# Patient Record
Sex: Female | Born: 1965
Health system: Southern US, Community
[De-identification: ages and names within clinical notes are randomized; demographics above are authoritative.]

## PROBLEM LIST (undated history)

## (undated) DIAGNOSIS — F419 Anxiety disorder, unspecified: Secondary | ICD-10-CM

## (undated) DIAGNOSIS — I10 Essential (primary) hypertension: Secondary | ICD-10-CM

## (undated) DIAGNOSIS — J302 Other seasonal allergic rhinitis: Secondary | ICD-10-CM

## (undated) DIAGNOSIS — F4310 Post-traumatic stress disorder, unspecified: Secondary | ICD-10-CM

## (undated) DIAGNOSIS — F329 Major depressive disorder, single episode, unspecified: Secondary | ICD-10-CM

## (undated) DIAGNOSIS — E119 Type 2 diabetes mellitus without complications: Secondary | ICD-10-CM

## (undated) DIAGNOSIS — E785 Hyperlipidemia, unspecified: Secondary | ICD-10-CM

## (undated) DIAGNOSIS — F32A Depression, unspecified: Secondary | ICD-10-CM

## (undated) DIAGNOSIS — M1990 Unspecified osteoarthritis, unspecified site: Secondary | ICD-10-CM

## (undated) HISTORY — DX: Unspecified osteoarthritis, unspecified site: M19.90

## (undated) HISTORY — DX: Type 2 diabetes mellitus without complications: E11.9

## (undated) HISTORY — DX: Post-traumatic stress disorder, unspecified: F43.10

## (undated) HISTORY — DX: Essential (primary) hypertension: I10

## (undated) HISTORY — DX: Hyperlipidemia, unspecified: E78.5

---

## 2004-12-15 ENCOUNTER — Encounter: Admission: RE | Admit: 2004-12-15 | Discharge: 2004-12-15 | Payer: Self-pay | Admitting: Internal Medicine

## 2005-07-31 ENCOUNTER — Emergency Department (HOSPITAL_COMMUNITY): Admission: EM | Admit: 2005-07-31 | Discharge: 2005-07-31 | Payer: Self-pay | Admitting: *Deleted

## 2005-12-29 ENCOUNTER — Encounter: Admission: RE | Admit: 2005-12-29 | Discharge: 2005-12-29 | Payer: Self-pay | Admitting: Internal Medicine

## 2006-01-06 ENCOUNTER — Encounter: Admission: RE | Admit: 2006-01-06 | Discharge: 2006-01-06 | Payer: Self-pay | Admitting: Internal Medicine

## 2010-06-02 ENCOUNTER — Other Ambulatory Visit: Payer: Self-pay | Admitting: *Deleted

## 2010-06-02 DIAGNOSIS — N6320 Unspecified lump in the left breast, unspecified quadrant: Secondary | ICD-10-CM

## 2010-06-06 ENCOUNTER — Ambulatory Visit
Admission: RE | Admit: 2010-06-06 | Discharge: 2010-06-06 | Disposition: A | Discharge status: Home / Self Care | Payer: BC Managed Care – PPO | Source: Ambulatory Visit | Attending: *Deleted | Admitting: *Deleted

## 2010-06-06 ENCOUNTER — Other Ambulatory Visit: Payer: Self-pay | Admitting: *Deleted

## 2010-06-06 DIAGNOSIS — N6320 Unspecified lump in the left breast, unspecified quadrant: Secondary | ICD-10-CM

## 2012-01-26 ENCOUNTER — Other Ambulatory Visit: Payer: Self-pay | Admitting: Family Medicine

## 2012-01-26 DIAGNOSIS — Z1231 Encounter for screening mammogram for malignant neoplasm of breast: Secondary | ICD-10-CM

## 2012-03-08 ENCOUNTER — Ambulatory Visit: Payer: BC Managed Care – PPO

## 2012-03-14 ENCOUNTER — Ambulatory Visit
Admission: RE | Admit: 2012-03-14 | Discharge: 2012-03-14 | Disposition: A | Discharge status: Home / Self Care | Payer: Private Health Insurance - Indemnity | Source: Ambulatory Visit | Attending: Family Medicine | Admitting: Family Medicine

## 2012-03-14 DIAGNOSIS — Z1231 Encounter for screening mammogram for malignant neoplasm of breast: Secondary | ICD-10-CM

## 2013-06-28 ENCOUNTER — Other Ambulatory Visit: Payer: Self-pay

## 2013-06-28 ENCOUNTER — Other Ambulatory Visit (HOSPITAL_COMMUNITY)
Admission: RE | Admit: 2013-06-28 | Discharge: 2013-06-28 | Disposition: A | Discharge status: Home / Self Care | Payer: BC Managed Care – PPO | Source: Ambulatory Visit | Attending: Family Medicine | Admitting: Family Medicine

## 2013-06-28 ENCOUNTER — Other Ambulatory Visit: Payer: Self-pay | Admitting: Family Medicine

## 2013-06-28 DIAGNOSIS — Z1231 Encounter for screening mammogram for malignant neoplasm of breast: Secondary | ICD-10-CM

## 2013-06-28 DIAGNOSIS — Z124 Encounter for screening for malignant neoplasm of cervix: Secondary | ICD-10-CM | POA: Insufficient documentation

## 2013-07-06 ENCOUNTER — Inpatient Hospital Stay: Admission: RE | Admit: 2013-07-06 | Payer: Private Health Insurance - Indemnity | Source: Ambulatory Visit

## 2013-08-01 ENCOUNTER — Ambulatory Visit: Payer: Private Health Insurance - Indemnity

## 2013-08-02 ENCOUNTER — Ambulatory Visit
Admission: RE | Admit: 2013-08-02 | Discharge: 2013-08-02 | Disposition: A | Discharge status: Home / Self Care | Payer: BC Managed Care – PPO | Source: Ambulatory Visit

## 2013-08-02 DIAGNOSIS — Z1231 Encounter for screening mammogram for malignant neoplasm of breast: Secondary | ICD-10-CM

## 2015-02-25 ENCOUNTER — Other Ambulatory Visit: Payer: Self-pay

## 2015-02-25 DIAGNOSIS — Z1231 Encounter for screening mammogram for malignant neoplasm of breast: Secondary | ICD-10-CM

## 2016-08-12 ENCOUNTER — Encounter (HOSPITAL_COMMUNITY): Payer: Self-pay | Admitting: Emergency Medicine

## 2016-08-12 ENCOUNTER — Emergency Department (HOSPITAL_COMMUNITY): Payer: 59

## 2016-08-12 ENCOUNTER — Emergency Department (HOSPITAL_COMMUNITY)
Admission: EM | Admit: 2016-08-12 | Discharge: 2016-08-12 | Disposition: A | Discharge status: Home / Self Care | Payer: 59 | Attending: Emergency Medicine | Admitting: Emergency Medicine

## 2016-08-12 DIAGNOSIS — Z79899 Other long term (current) drug therapy: Secondary | ICD-10-CM | POA: Insufficient documentation

## 2016-08-12 DIAGNOSIS — R0609 Other forms of dyspnea: Secondary | ICD-10-CM | POA: Insufficient documentation

## 2016-08-12 DIAGNOSIS — E119 Type 2 diabetes mellitus without complications: Secondary | ICD-10-CM | POA: Insufficient documentation

## 2016-08-12 DIAGNOSIS — Z794 Long term (current) use of insulin: Secondary | ICD-10-CM | POA: Diagnosis not present

## 2016-08-12 DIAGNOSIS — R0602 Shortness of breath: Secondary | ICD-10-CM

## 2016-08-12 DIAGNOSIS — R531 Weakness: Secondary | ICD-10-CM | POA: Diagnosis not present

## 2016-08-12 DIAGNOSIS — Z7982 Long term (current) use of aspirin: Secondary | ICD-10-CM | POA: Diagnosis not present

## 2016-08-12 HISTORY — DX: Depression, unspecified: F32.A

## 2016-08-12 HISTORY — DX: Major depressive disorder, single episode, unspecified: F32.9

## 2016-08-12 HISTORY — DX: Anxiety disorder, unspecified: F41.9

## 2016-08-12 HISTORY — DX: Type 2 diabetes mellitus without complications: E11.9

## 2016-08-12 HISTORY — DX: Other seasonal allergic rhinitis: J30.2

## 2016-08-12 LAB — CBC WITH DIFFERENTIAL/PLATELET
Basophils Absolute: 0 10*3/uL (ref 0.0–0.1)
Basophils Relative: 0 %
Eosinophils Absolute: 0.1 10*3/uL (ref 0.0–0.7)
Eosinophils Relative: 1 %
HEMATOCRIT: 37.9 % (ref 36.0–46.0)
HEMOGLOBIN: 13.2 g/dL (ref 12.0–15.0)
LYMPHS ABS: 2.6 10*3/uL (ref 0.7–4.0)
LYMPHS PCT: 36 %
MCH: 30.3 pg (ref 26.0–34.0)
MCHC: 34.8 g/dL (ref 30.0–36.0)
MCV: 86.9 fL (ref 78.0–100.0)
MONOS PCT: 4 %
Monocytes Absolute: 0.3 10*3/uL (ref 0.1–1.0)
NEUTROS PCT: 59 %
Neutro Abs: 4.4 10*3/uL (ref 1.7–7.7)
Platelets: 261 10*3/uL (ref 150–400)
RBC: 4.36 MIL/uL (ref 3.87–5.11)
RDW: 12.6 % (ref 11.5–15.5)
WBC: 7.4 10*3/uL (ref 4.0–10.5)

## 2016-08-12 LAB — COMPREHENSIVE METABOLIC PANEL
ALT: 26 U/L (ref 14–54)
AST: 32 U/L (ref 15–41)
Albumin: 3.7 g/dL (ref 3.5–5.0)
Alkaline Phosphatase: 45 U/L (ref 38–126)
Anion gap: 9 (ref 5–15)
BUN: 14 mg/dL (ref 6–20)
CHLORIDE: 105 mmol/L (ref 101–111)
CO2: 23 mmol/L (ref 22–32)
Calcium: 9.2 mg/dL (ref 8.9–10.3)
Creatinine, Ser: 0.75 mg/dL (ref 0.44–1.00)
Glucose, Bld: 215 mg/dL — ABNORMAL HIGH (ref 65–99)
POTASSIUM: 4.4 mmol/L (ref 3.5–5.1)
SODIUM: 137 mmol/L (ref 135–145)
Total Bilirubin: 0.6 mg/dL (ref 0.3–1.2)
Total Protein: 7.4 g/dL (ref 6.5–8.1)

## 2016-08-12 LAB — BRAIN NATRIURETIC PEPTIDE: B Natriuretic Peptide: 15 pg/mL (ref 0.0–100.0)

## 2016-08-12 LAB — I-STAT TROPONIN, ED: Troponin i, poc: 0.02 ng/mL (ref 0.00–0.08)

## 2016-08-12 LAB — TROPONIN I

## 2016-08-12 NOTE — ED Notes (Signed)
EDPA Provider at bedside. 

## 2016-08-12 NOTE — Discharge Instructions (Signed)
Your lab work, EKG, chest x-ray and EKG were reassuring today.   Given the reassuring work up today you are safe to be discharged today with follow up with your primary care provider within 7 days for further discussion of your chest discomfort and shortness of breath.  Your primary care provider may recommend cardiology referral or additional testing.   Continue taking your home medications as prescribed.   Return to the emergency department if your symptoms worsen, if you develop chest pain associated with shortness of breath, nausea, vomiting, light-headedness or your symptoms become concerning in any way

## 2016-08-12 NOTE — ED Provider Notes (Signed)
Patient was handed off to me by previous EDPA Upstill at shift change pending delta troponin and pulse ox with ambulation. Please see previous EDPA note for full HPI, ROS and PE.  Briefly patient is a 51 yo female with h/o T2DM who presents with mild chest discomfort and SOB with exertion.  H/o stress and anxiety. Plan at discharge is to discharge if troponin and pulse ox with ambulation is normal.    I reviewed patient's ED lab work.  VS have been wnl and stable in ED.  CBC, CMP, BNP, troponin x 2, CXR and EKG are reassuring.  Patient ambulated with SpO2 > 95%.  Denied CP or SOB or palpitations.   Patient considered safe for discharge at this time. ED return precautions given. Advised to f/u with PCP this week for further discussion of today's ED visit and possible cardiology referral.    Liberty HandyGibbons, Darcee Dekker J, PA-C 08/12/16 1659    Raeford RazorKohut, Stephen, MD 08/12/16 [phone]

## 2016-08-12 NOTE — ED Triage Notes (Signed)
Pt c/o shortness of breath and tachycardia x 2 days. Patient  States she has much stress and anxiety but yesterday SOB worsened when climbing stairs, pt states she was "really struggling to breathe and felt like chest was pounding". Pt teary in triage and worried it is cardiac related due to multiple family members with cardiac disease. Pt reports she is better at rest.

## 2016-08-12 NOTE — ED Notes (Signed)
PATIENT WALKED ONCE AROUND THE ED FLOOR O2 STATS WERE 95-100 HEART RATE 98 -110

## 2016-08-12 NOTE — ED Provider Notes (Signed)
[facility]-EMERGENCY DEPT Provider Note   CSN: [id number] Arrival date & time: 08/12/16  1143     History   Chief Complaint Chief Complaint  Patient presents with  . Shortness of Breath    HPI Mary Barrera is a 51 y.o. female.  Patient has been having increasing SOB with exertion over the 2-3 days. She report minimal chest discomfort when your symptoms of SOB are heightened. No nausea, vomiting. No cough or fever. She denies LE edema. She feels she has been overexerting herself recently with family events. No history of CHF, heart disease. She currently has no symptoms.    The history is provided by the patient. No language interpreter was used.  Shortness of Breath  Pertinent negatives include no fever, no chest pain, no vomiting, no abdominal pain and no leg swelling.    Past Medical History:  Diagnosis Date  . Anxiety   . Depression   . Diabetes mellitus without complication (HCC)   . Seasonal allergies     There are no active problems to display for this patient.   History reviewed. No pertinent surgical history.  OB History    No data available       Home Medications    Prior to Admission medications   Medication Sig Start Date End Date Taking? Authorizing Provider  aspirin EC 81 MG tablet Take 81 mg by mouth daily.   Yes [provider]  B Complex-C (B-COMPLEX WITH VITAMIN C) tablet Take 1 tablet by mouth daily.   Yes [provider]  celecoxib (CELEBREX) 200 MG capsule Take 200 mg by mouth daily. 07/17/16  Yes [provider]  clonazePAM (KLONOPIN) 1 MG tablet Take 1 mg by mouth 2 (two) times daily. 07/29/16  Yes [provider]  desvenlafaxine (PRISTIQ) 50 MG 24 hr tablet Take 50 mg by mouth daily. 07/29/16  Yes [provider]  fluticasone (FLONASE) 50 MCG/ACT nasal spray Place 2 sprays into both nostrils 2 (two) times daily as needed for allergies. 05/22/16  Yes [provider]  gabapentin (NEURONTIN) 100 MG  capsule Take 100 mg by mouth at bedtime.   Yes [provider]  JUNEL FE 1/20 1-20 MG-MCG tablet Take 1 tablet by mouth daily. 06/08/16  Yes [provider]  LANTUS SOLOSTAR 100 UNIT/ML Solostar Pen Inject 25 Units into the skin 2 (two) times daily. 07/15/16  Yes [provider]  ONGLYZA 5 MG TABS tablet Take 5 mg by mouth daily. 07/15/16  Yes [provider]  prazosin (MINIPRESS) 2 MG capsule Take 2 mg by mouth at bedtime. 07/29/16  Yes [provider]  ramipril (ALTACE) 5 MG capsule Take 5 mg by mouth at bedtime. 07/09/16  Yes [provider]  simvastatin (ZOCOR) 40 MG tablet Take 40 mg by mouth at bedtime. 07/28/16  Yes [provider]  tetrahydrozoline 0.05 % ophthalmic solution Place 1 drop into both eyes every 6 (six) hours as needed (irritated eyes).   Yes [provider]  VITAMIN D, CHOLECALCIFEROL, PO Take 2 tablets by mouth daily.   Yes [provider]    Family History History reviewed. No pertinent family history.  Social History Social History  Substance Use Topics  . Smoking status: Never Smoker  . Smokeless tobacco: Never Used  . Alcohol use No     Allergies   Atorvastatin; Tramadol; and Vicodin [hydrocodone-acetaminophen]   Review of Systems Review of Systems  Constitutional: Positive for fatigue. Negative for chills and fever.  Respiratory: Positive for shortness of breath.   Cardiovascular: Negative.  Negative for chest pain and leg swelling.  Gastrointestinal: Negative.  Negative for abdominal pain, nausea and vomiting.  Musculoskeletal: Negative.   Skin: Negative.   Neurological: Positive for weakness (Generalized).     Physical Exam Updated Vital Signs BP (!) 138/92   Pulse 82   Resp 18   SpO2 98%   Physical Exam  Constitutional: She is oriented to person, place, and time. She appears well-developed and well-nourished.  HENT:  Head: Normocephalic.  Neck: Normal range of  motion. Neck supple.  Cardiovascular: Normal rate and regular rhythm.   Pulmonary/Chest: Effort normal and breath sounds normal. She has no wheezes. She has no rales.  Abdominal: Soft. Bowel sounds are normal. There is no tenderness. There is no rebound and no guarding.  Musculoskeletal: Normal range of motion. She exhibits no edema.  Neurological: She is alert and oriented to person, place, and time.  Skin: Skin is warm and dry. No rash noted.  Psychiatric: She has a normal mood and affect.     ED Treatments / Results  Labs (all labs ordered are listed, but only abnormal results are displayed) Labs Reviewed  COMPREHENSIVE METABOLIC PANEL - Abnormal; Notable for the following:       Result Value   Glucose, Bld 215 (*)    All other components within normal limits  TROPONIN I  CBC WITH DIFFERENTIAL/PLATELET  BRAIN NATRIURETIC PEPTIDE    EKG  EKG Interpretation None       Radiology Dg Chest 2 View  Result Date: 08/12/2016 CLINICAL DATA:  Increasing shortness of breath and tachycardia EXAM: CHEST  2 VIEW COMPARISON:  04/08/2007 FINDINGS: The heart size and mediastinal contours are within normal limits. Both lungs are clear. The visualized skeletal structures are unremarkable. IMPRESSION: No active cardiopulmonary disease. Electronically Signed   By: Alcide CleverMark  Lukens M.D.   On: 08/12/2016 12:27    Procedures Procedures (including critical care time)  Medications Ordered in ED Medications - No data to display   Initial Impression / Assessment and Plan / ED Course  I have reviewed the triage vital signs and the nursing notes.  Pertinent labs & imaging results that were available during my care of the patient were reviewed by me and considered in my medical decision making (see chart for details).     Patient to ED with concern for worsening exertional dyspnea over the last 2-3 days without fever. Mild chest pain. No hsitory of CHF, or MI.   Labs are reassuring. Initial  troponin is <0.03. Delta trop pending. CXR clear.   Doubt dyspnea is cardiac in origin. Anticipate discharge home to follow up with PCP. Patient care signed out to Sharen Hecklaudia Gibbons, PA-C, for final re-evaluation and review of pending labs.     Final Clinical Impressions(s) / ED Diagnoses   Final diagnoses:  None   1. Exertional dyspnea  New Prescriptions New Prescriptions   No medications on file     Danne HarborUpstill, Shaneka Efaw, PA-C 08/15/16 2335    Lorre NickAllen, Anthony, MD 08/18/16 [phone]

## 2016-08-12 NOTE — ED Notes (Signed)
Patient transported to X-ray 

## 2016-08-17 ENCOUNTER — Telehealth: Payer: Self-pay

## 2016-08-17 NOTE — Telephone Encounter (Signed)
SENT NOTES TO SCHEDULING 

## 2016-08-19 ENCOUNTER — Encounter: Payer: Self-pay | Admitting: Interventional Cardiology

## 2016-08-19 DIAGNOSIS — R0602 Shortness of breath: Secondary | ICD-10-CM | POA: Insufficient documentation

## 2016-08-19 DIAGNOSIS — I10 Essential (primary) hypertension: Secondary | ICD-10-CM

## 2016-08-19 HISTORY — DX: Essential (primary) hypertension: I10

## 2016-08-19 NOTE — Progress Notes (Signed)
Cardiology Office Note    Date:  08/20/2016   ID:  Comer LocketKathy L Atiyeh, DOB 05/07/1965, MRN [id number]  PCP:  Mila PalmerWolters, Sharon, MD  Cardiologist: Lesleigh NoeHenry W Esten Dollar III, MD   Chief Complaint  Patient presents with  . Shortness of Breath    Dyspnea on exertion  . Palpitations    History of Present Illness:  Mary ArcherKathy L Barrera is a 51 y.o. female referred by Dr. Raeford RazorStephen Kohut for evaluation of shortness of breath  Since Mother's Day, Mrs. Broadus JohnWarren has been noticing dyspnea on exertion, strong beating of her heart, and fatigue. She has difficulty climbing the flight of stairs at work without dyspnea. There is no associated tightness/burning/discomfort in the chest. She denies orthopnea. He exertional intolerance is new, although she states she is generally very sedentary.  Multiple significant risk factors including diabetes with poor control (A1c greater than 7.5 when last evaluated), essential hypertension, hyperlipidemia, and obesity. Possible sleep apnea.  Past Medical History:  Diagnosis Date  . Anxiety   . Depression   . Diabetes mellitus without complication (HCC)   . Hypertension, essential 08/19/2016  . Seasonal allergies     History reviewed. No pertinent surgical history.  Current Medications: Outpatient Medications Prior to Visit  Medication Sig Dispense Refill  . aspirin EC 81 MG tablet Take 81 mg by mouth daily.    . B Complex-C (B-COMPLEX WITH VITAMIN C) tablet Take 1 tablet by mouth daily.    . celecoxib (CELEBREX) 200 MG capsule Take 200 mg by mouth daily.  5  . clonazePAM (KLONOPIN) 1 MG tablet Take 1 mg by mouth 2 (two) times daily.    Marland Kitchen. desvenlafaxine (PRISTIQ) 50 MG 24 hr tablet Take 50 mg by mouth daily.    . fluticasone (FLONASE) 50 MCG/ACT nasal spray Place 2 sprays into both nostrils 2 (two) times daily as needed for allergies.  3  . gabapentin (NEURONTIN) 100 MG capsule Take 100 mg by mouth at bedtime.    Colleen Can. JUNEL FE 1/20 1-20 MG-MCG tablet Take 1 tablet by mouth  daily.  1  . LANTUS SOLOSTAR 100 UNIT/ML Solostar Pen Inject 25 Units into the skin 2 (two) times daily.  5  . ONGLYZA 5 MG TABS tablet Take 5 mg by mouth daily.  5  . prazosin (MINIPRESS) 2 MG capsule Take 2 mg by mouth at bedtime.    . ramipril (ALTACE) 5 MG capsule Take 5 mg by mouth at bedtime.  4  . simvastatin (ZOCOR) 40 MG tablet Take 40 mg by mouth at bedtime.  1  . tetrahydrozoline 0.05 % ophthalmic solution Place 1 drop into both eyes every 6 (six) hours as needed (irritated eyes).    Marland Kitchen. VITAMIN D, CHOLECALCIFEROL, PO Take 2 tablets by mouth daily.     No facility-administered medications prior to visit.      Allergies:   Atorvastatin; Tramadol; and Vicodin [hydrocodone-acetaminophen]   Social History   Social History  . Marital status: Single    Spouse name: N/A  . Number of children: N/A  . Years of education: N/A   Social History Main Topics  . Smoking status: Never Smoker  . Smokeless tobacco: Never Used  . Alcohol use No  . Drug use: No  . Sexual activity: No   Other Topics Concern  . None   Social History Narrative  . None     Family History:  The patient's family history is not on file.   ROS:   Please see  the history of present illness.    Admits to PTSD and no details about precipitant. History of snoring, wheezing, anxiety, depression, chronic back pain, dizziness, easy bruising.  All other systems reviewed and are negative.   PHYSICAL EXAM:   VS:  BP 112/70   Pulse 83   Ht 5\' 6"  (1.676 m)   Wt 231 lb 12.8 oz (105.1 kg)   BMI 37.41 kg/m    GEN: Well nourished, well developed, in no acute distress . Moderate obesity HEENT: normal  Neck: no JVD, carotid bruits, or masses Cardiac: RRR; no murmurs, rubs, or gallops,no edema  Respiratory:  clear to auscultation bilaterally, normal work of breathing GI: soft, nontender, nondistended, + BS MS: no deformity or atrophy  Skin: warm and dry, no rash Neuro:  Alert and Oriented x 3, Strength and  sensation are intact Psych: euthymic mood, full affect  Wt Readings from Last 3 Encounters:  08/20/16 231 lb 12.8 oz (105.1 kg)      Studies/Labs Reviewed:   EKG:  EKG  Sinus rhythm, sinus arrhythmia, otherwise unremarkable.  Recent Labs: 08/12/2016: ALT 26; B Natriuretic Peptide 15.0; BUN 14; Creatinine, Ser 0.75; Hemoglobin 13.2; Platelets 261; Potassium 4.4; Sodium 137   Lipid Panel No results found for: CHOL, TRIG, HDL, CHOLHDL, VLDL, LDLCALC, LDLDIRECT  Additional studies/ records that were reviewed today include:  History of CK D.    ASSESSMENT:    1. Shortness of breath   2. Hypertension, essential   3. Type 2 diabetes mellitus with complication, with long-term current use of insulin (HCC)   4. Autosomal dominant narcolepsy, obesity, and type 2 diabetes, moderate (HCC)   5. Hyperlipidemia LDL goal <70      PLAN:  In order of problems listed above:  1. Dyspnea on exertion could be an anginal equivalent in this 51 year old with very poor control of diabetes, family history of CAD (mother and sister both died of CAD prior to age 79). Stress Cardiolite/Myoview to rule out ischemia, assess LV function, evaluate blood pressure control with activity. Continue aspirin daily. 2. Target blood pressure less than 130/90. Today's blood pressure is good. 3. Need to achieve a hemoglobin A1c less than 7. Encouraged aerobic activity after we are certain she does not have significant CAD. 4. Related to diabetes. Aerobic activity encouraged. 5. Last LDL was greater than 70. Target is less than 70.  Aspirin daily. Stress Cardiolite/Myoview to exclude ischemia as a cause of dyspnea. Aggressive risk factor modification and will encourage aerobic activity if myocardial perfusion study is unremarkable  Medication Adjustments/Labs and Tests Ordered: Current medicines are reviewed at length with the patient today.  Concerns regarding medicines are outlined above.  Medication changes, Labs  and Tests ordered today are listed in the Patient Instructions below. There are no Patient Instructions on file for this visit.   Signed, Lesleigh Noe, MD  08/20/2016 10:44 AM    [facility] [address] [place], [place], [place]  [address] Phone: [phone]; Fax: [phone]

## 2016-08-20 ENCOUNTER — Ambulatory Visit (INDEPENDENT_AMBULATORY_CARE_PROVIDER_SITE_OTHER): Payer: 59 | Admitting: Interventional Cardiology

## 2016-08-20 ENCOUNTER — Encounter: Payer: Self-pay | Admitting: Interventional Cardiology

## 2016-08-20 VITALS — BP 112/70 | HR 83 | Ht 66.0 in | Wt 231.8 lb

## 2016-08-20 DIAGNOSIS — E785 Hyperlipidemia, unspecified: Secondary | ICD-10-CM | POA: Diagnosis not present

## 2016-08-20 DIAGNOSIS — Z794 Long term (current) use of insulin: Secondary | ICD-10-CM

## 2016-08-20 DIAGNOSIS — G47419 Narcolepsy without cataplexy: Secondary | ICD-10-CM

## 2016-08-20 DIAGNOSIS — E669 Obesity, unspecified: Secondary | ICD-10-CM

## 2016-08-20 DIAGNOSIS — R0602 Shortness of breath: Secondary | ICD-10-CM

## 2016-08-20 DIAGNOSIS — E118 Type 2 diabetes mellitus with unspecified complications: Secondary | ICD-10-CM | POA: Diagnosis not present

## 2016-08-20 DIAGNOSIS — I10 Essential (primary) hypertension: Secondary | ICD-10-CM | POA: Diagnosis not present

## 2016-08-20 DIAGNOSIS — E119 Type 2 diabetes mellitus without complications: Secondary | ICD-10-CM | POA: Diagnosis not present

## 2016-08-20 NOTE — Patient Instructions (Signed)
Medication Instructions:  None  Labwork: None  Testing/Procedures: Your physician has requested that you have en exercise stress myoview. For further information please visit [email or web address]. Please follow instruction sheet, as given.   Follow-Up: Your physician recommends that you schedule a follow-up appointment as needed with Dr. Katrinka BlazingSmith.    Any Other Special Instructions Will Be Listed Below (If Applicable).  Your physician discussed the importance of regular exercise and recommended that you start or continue a regular exercise program for good health.    If you need a refill on your cardiac medications before your next appointment, please call your pharmacy.

## 2016-08-25 ENCOUNTER — Telehealth (HOSPITAL_COMMUNITY): Payer: Self-pay | Admitting: *Deleted

## 2016-08-25 NOTE — Telephone Encounter (Signed)
Patient given detailed instructions per Myocardial Perfusion Study Information Sheet for the test on 08/27/16 at 0945. Patient notified to arrive 15 minutes early and that it is imperative to arrive on time for appointment to keep from having the test rescheduled.  If you need to cancel or reschedule your appointment, please call the office within 24 hours of your appointment. . Patient verbalized understanding.Francile Woolford, Adelene IdlerCynthia W

## 2016-08-27 ENCOUNTER — Ambulatory Visit (HOSPITAL_COMMUNITY): Payer: 59 | Attending: Internal Medicine

## 2016-08-27 DIAGNOSIS — E785 Hyperlipidemia, unspecified: Secondary | ICD-10-CM | POA: Diagnosis not present

## 2016-08-27 DIAGNOSIS — R0602 Shortness of breath: Secondary | ICD-10-CM | POA: Diagnosis present

## 2016-08-27 DIAGNOSIS — E119 Type 2 diabetes mellitus without complications: Secondary | ICD-10-CM | POA: Diagnosis not present

## 2016-08-27 DIAGNOSIS — I10 Essential (primary) hypertension: Secondary | ICD-10-CM | POA: Diagnosis present

## 2016-08-27 LAB — MYOCARDIAL PERFUSION IMAGING
CHL CUP NUCLEAR SSS: 15
CSEPPHR: 125 {beats}/min
LHR: 0.29
LV sys vol: 12 mL
LVDIAVOL: 50 mL (ref 46–106)
Rest HR: 86 {beats}/min
SDS: 2
SRS: 13
TID: 1.02

## 2016-08-27 MED ORDER — TECHNETIUM TC 99M TETROFOSMIN IV KIT
32.6000 | PACK | Freq: Once | INTRAVENOUS | Status: AC | PRN
  Administered 2016-08-27: 32.6 via INTRAVENOUS
  Filled 2016-08-27: qty 33

## 2016-08-27 MED ORDER — REGADENOSON 0.4 MG/5ML IV SOLN
0.4000 mg | Freq: Once | INTRAVENOUS | Status: AC
  Administered 2016-08-27: 0.4 mg via INTRAVENOUS

## 2016-08-27 MED ORDER — TECHNETIUM TC 99M TETROFOSMIN IV KIT
10.4000 | PACK | Freq: Once | INTRAVENOUS | Status: AC | PRN
  Administered 2016-08-27: 10.4 via INTRAVENOUS
  Filled 2016-08-27: qty 11

## 2016-08-28 ENCOUNTER — Telehealth: Payer: Self-pay | Admitting: Interventional Cardiology

## 2016-08-28 NOTE — Telephone Encounter (Signed)
New message  Pt call requesting to speak with RN about test recently completed. Pt states her Provider Dr. Paulino RilyWolters wants to do more test on her. Pt states she thinks this information is incorrect. Pt would like RN to call Dr. Paulino RilyWolters office to clarify information. Please call back to discuss if needed

## 2016-08-28 NOTE — Telephone Encounter (Signed)
Spoke with pt and she states that she contacted PCP this morning about getting set up for mammogram d/t area found in her breast during myoview.  She states they called her back and said Dr. Michaelle CopasSmith's note mentioned further testing and they did not want to do anything until his testing was complete.  Pt feels this is incorrect because Myoview was the only thing ordered at her OV.  Advised pt this was correct and we didn't have any further testing we had planned to do. Advised I would call and speak with Dr. Paulino RilyWolters' nurse.  Pt appreciative for assistance.  Left message with Dr. Mick SellWolter' nurse, Alona BeneJoyce to call back to discuss.

## 2016-08-28 NOTE — Telephone Encounter (Signed)
Patient is returning  Your call.Thanks.

## 2016-08-28 NOTE — Telephone Encounter (Signed)
Left message to call back  

## 2016-09-01 ENCOUNTER — Other Ambulatory Visit: Payer: Self-pay | Admitting: Family Medicine

## 2016-09-01 DIAGNOSIS — N63 Unspecified lump in unspecified breast: Secondary | ICD-10-CM

## 2016-09-07 ENCOUNTER — Other Ambulatory Visit: Payer: 59

## 2016-10-13 ENCOUNTER — Institutional Professional Consult (permissible substitution): Payer: 59 | Admitting: Internal Medicine

## 2017-02-22 DIAGNOSIS — F410 Panic disorder [episodic paroxysmal anxiety] without agoraphobia: Secondary | ICD-10-CM | POA: Diagnosis not present

## 2017-02-22 DIAGNOSIS — F331 Major depressive disorder, recurrent, moderate: Secondary | ICD-10-CM | POA: Diagnosis not present

## 2017-02-22 DIAGNOSIS — F411 Generalized anxiety disorder: Secondary | ICD-10-CM | POA: Diagnosis not present

## 2017-02-23 DIAGNOSIS — H25013 Cortical age-related cataract, bilateral: Secondary | ICD-10-CM | POA: Diagnosis not present

## 2017-02-23 DIAGNOSIS — I10 Essential (primary) hypertension: Secondary | ICD-10-CM | POA: Diagnosis not present

## 2017-02-23 DIAGNOSIS — E1165 Type 2 diabetes mellitus with hyperglycemia: Secondary | ICD-10-CM | POA: Diagnosis not present

## 2017-02-23 DIAGNOSIS — H35033 Hypertensive retinopathy, bilateral: Secondary | ICD-10-CM | POA: Diagnosis not present

## 2017-05-05 DIAGNOSIS — F410 Panic disorder [episodic paroxysmal anxiety] without agoraphobia: Secondary | ICD-10-CM | POA: Diagnosis not present

## 2017-05-05 DIAGNOSIS — F331 Major depressive disorder, recurrent, moderate: Secondary | ICD-10-CM | POA: Diagnosis not present

## 2017-05-05 DIAGNOSIS — F411 Generalized anxiety disorder: Secondary | ICD-10-CM | POA: Diagnosis not present

## 2017-05-11 DIAGNOSIS — H25011 Cortical age-related cataract, right eye: Secondary | ICD-10-CM | POA: Diagnosis not present

## 2017-05-11 DIAGNOSIS — H25012 Cortical age-related cataract, left eye: Secondary | ICD-10-CM | POA: Diagnosis not present

## 2017-05-11 DIAGNOSIS — H2511 Age-related nuclear cataract, right eye: Secondary | ICD-10-CM | POA: Diagnosis not present

## 2017-05-11 DIAGNOSIS — H2512 Age-related nuclear cataract, left eye: Secondary | ICD-10-CM | POA: Diagnosis not present

## 2017-05-24 DIAGNOSIS — F410 Panic disorder [episodic paroxysmal anxiety] without agoraphobia: Secondary | ICD-10-CM | POA: Diagnosis not present

## 2017-05-24 DIAGNOSIS — F331 Major depressive disorder, recurrent, moderate: Secondary | ICD-10-CM | POA: Diagnosis not present

## 2017-05-24 DIAGNOSIS — F411 Generalized anxiety disorder: Secondary | ICD-10-CM | POA: Diagnosis not present

## 2017-05-28 HISTORY — PX: CATARACT EXTRACTION, BILATERAL: SHX1313

## 2017-06-09 DIAGNOSIS — H25042 Posterior subcapsular polar age-related cataract, left eye: Secondary | ICD-10-CM | POA: Diagnosis not present

## 2017-06-09 DIAGNOSIS — H2512 Age-related nuclear cataract, left eye: Secondary | ICD-10-CM | POA: Diagnosis not present

## 2017-06-09 DIAGNOSIS — H25041 Posterior subcapsular polar age-related cataract, right eye: Secondary | ICD-10-CM | POA: Diagnosis not present

## 2017-06-09 DIAGNOSIS — H2511 Age-related nuclear cataract, right eye: Secondary | ICD-10-CM | POA: Diagnosis not present

## 2017-06-16 DIAGNOSIS — H2512 Age-related nuclear cataract, left eye: Secondary | ICD-10-CM | POA: Diagnosis not present

## 2017-06-16 DIAGNOSIS — H25042 Posterior subcapsular polar age-related cataract, left eye: Secondary | ICD-10-CM | POA: Diagnosis not present

## 2017-06-17 DIAGNOSIS — J309 Allergic rhinitis, unspecified: Secondary | ICD-10-CM | POA: Diagnosis not present

## 2017-06-17 DIAGNOSIS — M519 Unspecified thoracic, thoracolumbar and lumbosacral intervertebral disc disorder: Secondary | ICD-10-CM | POA: Diagnosis not present

## 2017-06-17 DIAGNOSIS — E1165 Type 2 diabetes mellitus with hyperglycemia: Secondary | ICD-10-CM | POA: Diagnosis not present

## 2017-06-17 DIAGNOSIS — I10 Essential (primary) hypertension: Secondary | ICD-10-CM | POA: Diagnosis not present

## 2017-06-24 DIAGNOSIS — E119 Type 2 diabetes mellitus without complications: Secondary | ICD-10-CM | POA: Diagnosis not present

## 2017-07-30 DIAGNOSIS — F411 Generalized anxiety disorder: Secondary | ICD-10-CM | POA: Diagnosis not present

## 2017-07-30 DIAGNOSIS — F331 Major depressive disorder, recurrent, moderate: Secondary | ICD-10-CM | POA: Diagnosis not present

## 2017-07-30 DIAGNOSIS — F410 Panic disorder [episodic paroxysmal anxiety] without agoraphobia: Secondary | ICD-10-CM | POA: Diagnosis not present

## 2017-08-16 DIAGNOSIS — F411 Generalized anxiety disorder: Secondary | ICD-10-CM | POA: Diagnosis not present

## 2017-08-16 DIAGNOSIS — F331 Major depressive disorder, recurrent, moderate: Secondary | ICD-10-CM | POA: Diagnosis not present

## 2017-08-16 DIAGNOSIS — F410 Panic disorder [episodic paroxysmal anxiety] without agoraphobia: Secondary | ICD-10-CM | POA: Diagnosis not present

## 2017-08-17 DIAGNOSIS — E668 Other obesity: Secondary | ICD-10-CM | POA: Diagnosis not present

## 2017-08-17 DIAGNOSIS — E119 Type 2 diabetes mellitus without complications: Secondary | ICD-10-CM | POA: Diagnosis not present

## 2017-08-17 DIAGNOSIS — E782 Mixed hyperlipidemia: Secondary | ICD-10-CM | POA: Diagnosis not present

## 2017-10-18 DIAGNOSIS — F332 Major depressive disorder, recurrent severe without psychotic features: Secondary | ICD-10-CM | POA: Diagnosis not present

## 2017-10-18 DIAGNOSIS — F429 Obsessive-compulsive disorder, unspecified: Secondary | ICD-10-CM | POA: Diagnosis not present

## 2017-10-18 DIAGNOSIS — F5101 Primary insomnia: Secondary | ICD-10-CM | POA: Diagnosis not present

## 2017-10-18 DIAGNOSIS — F411 Generalized anxiety disorder: Secondary | ICD-10-CM | POA: Diagnosis not present

## 2017-10-25 DIAGNOSIS — F429 Obsessive-compulsive disorder, unspecified: Secondary | ICD-10-CM | POA: Diagnosis not present

## 2017-10-25 DIAGNOSIS — F5101 Primary insomnia: Secondary | ICD-10-CM | POA: Diagnosis not present

## 2017-10-25 DIAGNOSIS — F411 Generalized anxiety disorder: Secondary | ICD-10-CM | POA: Diagnosis not present

## 2017-10-25 DIAGNOSIS — F332 Major depressive disorder, recurrent severe without psychotic features: Secondary | ICD-10-CM | POA: Diagnosis not present

## 2017-12-17 DIAGNOSIS — F411 Generalized anxiety disorder: Secondary | ICD-10-CM | POA: Diagnosis not present

## 2017-12-27 ENCOUNTER — Encounter: Payer: Self-pay | Admitting: Obstetrics and Gynecology

## 2018-01-10 DIAGNOSIS — N760 Acute vaginitis: Secondary | ICD-10-CM | POA: Diagnosis not present

## 2018-01-10 DIAGNOSIS — Z6834 Body mass index (BMI) 34.0-34.9, adult: Secondary | ICD-10-CM | POA: Diagnosis not present

## 2018-01-10 DIAGNOSIS — Z113 Encounter for screening for infections with a predominantly sexual mode of transmission: Secondary | ICD-10-CM | POA: Diagnosis not present

## 2018-01-10 DIAGNOSIS — Z01419 Encounter for gynecological examination (general) (routine) without abnormal findings: Secondary | ICD-10-CM | POA: Diagnosis not present

## 2018-01-11 ENCOUNTER — Other Ambulatory Visit: Payer: Self-pay | Admitting: Obstetrics and Gynecology

## 2018-01-11 DIAGNOSIS — N6320 Unspecified lump in the left breast, unspecified quadrant: Secondary | ICD-10-CM

## 2018-01-14 ENCOUNTER — Other Ambulatory Visit: Payer: 59

## 2018-01-14 DIAGNOSIS — F411 Generalized anxiety disorder: Secondary | ICD-10-CM | POA: Diagnosis not present

## 2018-01-24 ENCOUNTER — Other Ambulatory Visit: Payer: 59

## 2018-01-24 ENCOUNTER — Encounter: Payer: BLUE CROSS/BLUE SHIELD | Admitting: Obstetrics and Gynecology

## 2018-01-24 ENCOUNTER — Encounter

## 2018-03-16 DIAGNOSIS — B349 Viral infection, unspecified: Secondary | ICD-10-CM | POA: Diagnosis not present

## 2018-03-16 DIAGNOSIS — J0100 Acute maxillary sinusitis, unspecified: Secondary | ICD-10-CM | POA: Diagnosis not present

## 2018-04-25 DIAGNOSIS — F4310 Post-traumatic stress disorder, unspecified: Secondary | ICD-10-CM | POA: Diagnosis not present

## 2018-04-26 DIAGNOSIS — F331 Major depressive disorder, recurrent, moderate: Secondary | ICD-10-CM | POA: Diagnosis not present

## 2018-05-23 DIAGNOSIS — F4310 Post-traumatic stress disorder, unspecified: Secondary | ICD-10-CM | POA: Diagnosis not present

## 2018-05-23 DIAGNOSIS — F331 Major depressive disorder, recurrent, moderate: Secondary | ICD-10-CM | POA: Diagnosis not present

## 2018-05-23 DIAGNOSIS — F419 Anxiety disorder, unspecified: Secondary | ICD-10-CM | POA: Diagnosis not present

## 2018-06-06 ENCOUNTER — Ambulatory Visit: Payer: BLUE CROSS/BLUE SHIELD | Admitting: Physician Assistant

## 2018-06-20 DIAGNOSIS — E559 Vitamin D deficiency, unspecified: Secondary | ICD-10-CM | POA: Diagnosis not present

## 2018-06-20 DIAGNOSIS — E7800 Pure hypercholesterolemia, unspecified: Secondary | ICD-10-CM | POA: Diagnosis not present

## 2018-06-20 DIAGNOSIS — I10 Essential (primary) hypertension: Secondary | ICD-10-CM | POA: Diagnosis not present

## 2018-06-20 DIAGNOSIS — E1165 Type 2 diabetes mellitus with hyperglycemia: Secondary | ICD-10-CM | POA: Diagnosis not present

## 2018-06-20 DIAGNOSIS — Z79899 Other long term (current) drug therapy: Secondary | ICD-10-CM | POA: Diagnosis not present

## 2018-06-27 ENCOUNTER — Other Ambulatory Visit: Payer: Self-pay

## 2018-06-27 ENCOUNTER — Ambulatory Visit: Payer: BLUE CROSS/BLUE SHIELD | Admitting: Physician Assistant

## 2018-06-29 DIAGNOSIS — E782 Mixed hyperlipidemia: Secondary | ICD-10-CM | POA: Diagnosis not present

## 2018-06-29 DIAGNOSIS — E119 Type 2 diabetes mellitus without complications: Secondary | ICD-10-CM | POA: Diagnosis not present

## 2018-06-29 DIAGNOSIS — E668 Other obesity: Secondary | ICD-10-CM | POA: Diagnosis not present

## 2018-07-04 ENCOUNTER — Encounter: Payer: Self-pay | Admitting: Physician Assistant

## 2018-07-04 ENCOUNTER — Other Ambulatory Visit: Payer: Self-pay

## 2018-07-04 ENCOUNTER — Ambulatory Visit (INDEPENDENT_AMBULATORY_CARE_PROVIDER_SITE_OTHER): Payer: BLUE CROSS/BLUE SHIELD | Admitting: Physician Assistant

## 2018-07-04 DIAGNOSIS — F4310 Post-traumatic stress disorder, unspecified: Secondary | ICD-10-CM

## 2018-07-04 DIAGNOSIS — F411 Generalized anxiety disorder: Secondary | ICD-10-CM | POA: Diagnosis not present

## 2018-07-04 DIAGNOSIS — F331 Major depressive disorder, recurrent, moderate: Secondary | ICD-10-CM

## 2018-07-04 MED ORDER — DESVENLAFAXINE SUCCINATE ER 100 MG PO TB24: 100.0000 mg | ORAL_TABLET | Freq: Every day | ORAL | 1 refills | Status: DC

## 2018-07-04 MED ORDER — BUSPIRONE HCL 15 MG PO TABS: 15.0000 mg | ORAL_TABLET | Freq: Two times a day (BID) | ORAL | 1 refills | Status: DC

## 2018-07-04 MED ORDER — PRAZOSIN HCL 2 MG PO CAPS: 2.0000 mg | ORAL_CAPSULE | Freq: Every day | ORAL | 1 refills | Status: DC

## 2018-07-04 MED ORDER — HYDROXYZINE HCL 50 MG PO TABS: 50.0000 mg | ORAL_TABLET | Freq: Three times a day (TID) | ORAL | 1 refills | Status: DC | PRN
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
## 2018-08-26 ENCOUNTER — Encounter: Payer: Self-pay | Admitting: Physician Assistant

## 2018-08-26 ENCOUNTER — Ambulatory Visit (INDEPENDENT_AMBULATORY_CARE_PROVIDER_SITE_OTHER): Payer: BLUE CROSS/BLUE SHIELD | Admitting: Physician Assistant

## 2018-08-26 ENCOUNTER — Other Ambulatory Visit: Payer: Self-pay

## 2018-08-26 DIAGNOSIS — F4310 Post-traumatic stress disorder, unspecified: Secondary | ICD-10-CM | POA: Diagnosis not present

## 2018-08-26 DIAGNOSIS — F331 Major depressive disorder, recurrent, moderate: Secondary | ICD-10-CM

## 2018-08-26 DIAGNOSIS — F411 Generalized anxiety disorder: Secondary | ICD-10-CM | POA: Diagnosis not present

## 2018-08-26 MED ORDER — HYDROXYZINE HCL 50 MG PO TABS: 50.0000 mg | ORAL_TABLET | Freq: Three times a day (TID) | ORAL | 3 refills | Status: DC | PRN

## 2018-08-26 MED ORDER — BUSPIRONE HCL 15 MG PO TABS: ORAL_TABLET | ORAL | 1 refills | Status: DC

## 2018-08-26 MED ORDER — PRAZOSIN HCL 2 MG PO CAPS: 2.0000 mg | ORAL_CAPSULE | Freq: Every day | ORAL | 3 refills | Status: DC

## 2018-08-26 MED ORDER — DESVENLAFAXINE SUCCINATE ER 100 MG PO TB24: 100.0000 mg | ORAL_TABLET | Freq: Every day | ORAL | 3 refills | Status: DC

## 2018-08-26 NOTE — Progress Notes (Signed)
[facility] Med Check  Patient ID: Mary Barrera,  MRN: [id number]  PCP: Mila Palmer, MD  Date of Evaluation: 08/26/2018 Time spent:15 minutes  Chief Complaint:  Chief Complaint    Anxiety; Follow-up     Virtual Visit via Telephone Note  I connected with patient by a video enabled telemedicine application or telephone, with their informed consent, and verified patient privacy and that I am speaking with the correct person using two identifiers.  I am private, in my home and the patient is in her car.  I discussed the limitations, risks, security and privacy concerns of performing an evaluation and management service by telephone and the availability of in person appointments. I also discussed with the patient that there may be a patient responsible charge related to this service. The patient expressed understanding and agreed to proceed.   I discussed the assessment and treatment plan with the patient. The patient was provided an opportunity to ask questions and all were answered. The patient agreed with the plan and demonstrated an understanding of the instructions.   The patient was advised to call back or seek an in-person evaluation if the symptoms worsen or if the condition fails to improve as anticipated.  I provided 15 minutes of non-face-to-face time during this encounter.  HISTORY/CURRENT STATUS: HPI for 6-week med check.  At her last visit we increased the BuSpar to 15 mg twice daily.  She states she is at least 25% better.  Rarely, she has taken an extra half of the BuSpar during the day which she feels has helped.  She has 2 coworkers that because a lot of the anxiety.  States they are both very anxious and need to be on medicine.  Patient takes hydroxyzine for anxiety as well as sleep and it does help both problems.  Patient denies loss of interest in usual activities and is able to enjoy things.  Denies decreased energy or motivation.  Appetite has not changed.   No extreme sadness, tearfulness, or feelings of hopelessness.  Denies any changes in concentration, making decisions or remembering things.  Denies suicidal or homicidal thoughts.  Denies dizziness, syncope, seizures, numbness, tingling, tremor, tics, unsteady gait, slurred speech, confusion. Denies muscle or joint pain, stiffness, or dystonia.  Individual Medical History/ Review of Systems: Changes? :No    Past medications for mental health diagnoses include: Gabapentin for back pain caused tremor, Klonopin helped but caused memory issues, Celexa, and 1 other that she cannot remember.  States Pristiq has been the best thing that she is taken.  Allergies: Atorvastatin; Oxycodone; Tramadol; and Vicodin [hydrocodone-acetaminophen]  Current Medications:  Current Outpatient Medications:  .  aspirin EC 81 MG tablet, Take 81 mg by mouth daily., Disp: , Rfl:  .  B Complex-C (B-COMPLEX WITH VITAMIN C) tablet, Take 1 tablet by mouth daily., Disp: , Rfl:  .  busPIRone (BUSPAR) 15 MG tablet, 2 po q am, 1 qhs, Disp: 90 tablet, Rfl: 1 .  celecoxib (CELEBREX) 200 MG capsule, Take 200 mg by mouth daily., Disp: , Rfl: 5 .  desvenlafaxine (PRISTIQ) 100 MG 24 hr tablet, Take 1 tablet (100 mg total) by mouth daily., Disp: 90 tablet, Rfl: 3 .  fluticasone (FLONASE) 50 MCG/ACT nasal spray, Place 2 sprays into both nostrils 2 (two) times daily as needed for allergies., Disp: , Rfl: 3 .  hydrOXYzine (ATARAX/VISTARIL) 50 MG tablet, Take 1 tablet (50 mg total) by mouth 3 (three) times daily as needed., Disp: 90 tablet, Rfl: 3 .  hydrOXYzine (VISTARIL) 50 MG capsule, TK 2 CS PO QHS, Disp: , Rfl:  .  Insulin Glargine (BASAGLAR KWIKPEN) 100 UNIT/ML SOPN, INJ 30 UNITS [place] QHS UTD, Disp: , Rfl:  .  OZEMPIC, 0.25 OR 0.5 MG/DOSE, 2 MG/1.5ML SOPN, , Disp: , Rfl:  .  prazosin (MINIPRESS) 2 MG capsule, Take 1 capsule (2 mg total) by mouth at bedtime., Disp: 30 capsule, Rfl: 3 .  ramipril (ALTACE) 5 MG capsule, Take 5 mg by  mouth at bedtime., Disp: , Rfl: 4 .  simvastatin (ZOCOR) 40 MG tablet, Take 40 mg by mouth at bedtime., Disp: , Rfl: 1 .  JUNEL FE 1/20 1-20 MG-MCG tablet, Take 1 tablet by mouth daily., Disp: , Rfl: 1 .  LANTUS SOLOSTAR 100 UNIT/ML Solostar Pen, Inject 25 Units into the skin 2 (two) times daily., Disp: , Rfl: 5 .  ONGLYZA 5 MG TABS tablet, Take 5 mg by mouth daily., Disp: , Rfl: 5 .  tetrahydrozoline 0.05 % ophthalmic solution, Place 1 drop into both eyes every 6 (six) hours as needed (irritated eyes)., Disp: , Rfl:  .  VITAMIN D, CHOLECALCIFEROL, PO, Take 2 tablets by mouth daily., Disp: , Rfl:  Medication Side Effects: none  Family Medical/ Social History: Changes? No  MENTAL HEALTH EXAM:  There were no vitals taken for this visit.There is no height or weight on file to calculate BMI.  General Appearance: unable to assess  Eye Contact:  unable to assess  Speech:  Clear and Coherent  Volume:  Normal  Mood:  Euthymic  Affect:  unable to assess  Thought Process:  Goal Directed  Orientation:  Full (Time, Place, and Person)  Thought Content: Logical   Suicidal Thoughts:  No  Homicidal Thoughts:  No  Memory:  WNL  Judgement:  Good  Insight:  Good  Psychomotor Activity:  EPS and unable to assess  Concentration:  Concentration: Good  Recall:  Good  Fund of Knowledge: Good  Language: Good  Assets:  Desire for Improvement  ADL's:  Intact  Cognition: WNL  Prognosis:  Good    DIAGNOSES:    ICD-10-CM   1. Generalized anxiety disorder F41.1   2. Major depressive disorder, recurrent episode, moderate (HCC) F33.1   3. PTSD (post-traumatic stress disorder) F43.10     Receiving Psychotherapy: Yes Arline Aspat Collier but has not seen in several months because of the coronavirus pandemic.   RECOMMENDATIONS:  Increase BuSpar to 15 mg, 2 p.o. every morning and 1 nightly.  She can do 1 3 times daily but we are both concerned that she will forget the middle of the day dose. Continue Pristiq 100  mg every morning. Continue hydroxyzine 50 mg 3 times daily as needed anxiety or sleep. Continue prazosin 2 mg nightly. Continue vitamin D. Continue psychotherapy with Arline AspPat Collier as soon as she can get in. Return in 6 weeks.  Melony Overlyeresa Cyriah Childrey, PA-C   This record has been created using [employer] software.  Chart creation errors have been sought, but may not always have been located and corrected. Such creation errors do not reflect on the standard of medical care.

## 2018-09-12 DIAGNOSIS — E7800 Pure hypercholesterolemia, unspecified: Secondary | ICD-10-CM | POA: Diagnosis not present

## 2018-11-04 ENCOUNTER — Other Ambulatory Visit: Payer: Self-pay | Admitting: Physician Assistant

## 2018-11-17 ENCOUNTER — Telehealth: Payer: Self-pay | Admitting: Physician Assistant

## 2018-11-17 NOTE — Telephone Encounter (Signed)
Pt feeling suicidal would like someone to call her as soon as possible.

## 2018-11-18 ENCOUNTER — Other Ambulatory Visit: Payer: Self-pay | Admitting: Physician Assistant

## 2018-11-18 MED ORDER — BUSPIRONE HCL 15 MG PO TABS: ORAL_TABLET | ORAL | 0 refills | Status: DC

## 2018-11-18 MED ORDER — ALPRAZOLAM 0.5 MG PO TABS: 0.2500 mg | ORAL_TABLET | Freq: Two times a day (BID) | ORAL | 0 refills | Status: DC | PRN

## 2018-11-18 NOTE — Telephone Encounter (Signed)
See progress note.

## 2018-11-18 NOTE — Telephone Encounter (Signed)
Thank you.  Mary Barrera is not working this AM. So let me know the status once you reach her.  If she's OK to wait to talk to Mary Barrera then send message to Mary Barrera who is working this PM

## 2018-11-18 NOTE — Telephone Encounter (Signed)
Left detailed message to call back and discuss again.

## 2018-11-18 NOTE — Progress Notes (Signed)
I spoke w/ pt.  Under a lot stress.  Her sister died yesterday, and before that, a lot of other stressors were going on. "I'm not going to kill myself but I feel like I'd be better off if I wasn't here.  I'm not going to do anything.  I'm all my kids have.  I'm the only grandparent my grandkids have.  I just need some help.  Somebody to talk to."   Hasn't seen Jennye Moccasin lately but will call her.  Recommend increase Buspar 15 mg 2 bid, and add Xanax 0.5 mg.  See med sheet.  Go to ER if worse.

## 2018-12-15 ENCOUNTER — Other Ambulatory Visit: Payer: Self-pay | Admitting: Physician Assistant

## 2018-12-15 NOTE — Telephone Encounter (Signed)
Has appt 12/26/2018

## 2018-12-26 ENCOUNTER — Other Ambulatory Visit: Payer: Self-pay

## 2018-12-26 ENCOUNTER — Ambulatory Visit: Payer: BLUE CROSS/BLUE SHIELD | Admitting: Physician Assistant

## 2019-01-02 DIAGNOSIS — Z20828 Contact with and (suspected) exposure to other viral communicable diseases: Secondary | ICD-10-CM | POA: Diagnosis not present

## 2019-01-02 DIAGNOSIS — B349 Viral infection, unspecified: Secondary | ICD-10-CM | POA: Diagnosis not present

## 2019-01-03 ENCOUNTER — Other Ambulatory Visit: Payer: Self-pay | Admitting: Physician Assistant

## 2019-01-03 DIAGNOSIS — R05 Cough: Secondary | ICD-10-CM | POA: Diagnosis not present

## 2019-01-06 ENCOUNTER — Other Ambulatory Visit: Payer: Self-pay | Admitting: Physician Assistant

## 2019-01-07 ENCOUNTER — Other Ambulatory Visit: Payer: Self-pay | Admitting: Physician Assistant

## 2019-01-10 ENCOUNTER — Emergency Department (HOSPITAL_COMMUNITY)
Admission: EM | Admit: 2019-01-10 | Discharge: 2019-01-10 | Disposition: A | Discharge status: Home / Self Care | Payer: BC Managed Care – PPO | Attending: Emergency Medicine | Admitting: Emergency Medicine

## 2019-01-10 ENCOUNTER — Emergency Department (HOSPITAL_COMMUNITY): Payer: BC Managed Care – PPO

## 2019-01-10 ENCOUNTER — Encounter (HOSPITAL_COMMUNITY): Payer: Self-pay

## 2019-01-10 ENCOUNTER — Other Ambulatory Visit: Payer: Self-pay

## 2019-01-10 DIAGNOSIS — R519 Headache, unspecified: Secondary | ICD-10-CM | POA: Insufficient documentation

## 2019-01-10 DIAGNOSIS — U071 COVID-19: Secondary | ICD-10-CM | POA: Diagnosis not present

## 2019-01-10 DIAGNOSIS — R05 Cough: Secondary | ICD-10-CM | POA: Diagnosis not present

## 2019-01-10 DIAGNOSIS — R110 Nausea: Secondary | ICD-10-CM | POA: Diagnosis not present

## 2019-01-10 DIAGNOSIS — Z7982 Long term (current) use of aspirin: Secondary | ICD-10-CM | POA: Insufficient documentation

## 2019-01-10 DIAGNOSIS — Z79899 Other long term (current) drug therapy: Secondary | ICD-10-CM | POA: Insufficient documentation

## 2019-01-10 DIAGNOSIS — R509 Fever, unspecified: Secondary | ICD-10-CM | POA: Diagnosis not present

## 2019-01-10 DIAGNOSIS — R0602 Shortness of breath: Secondary | ICD-10-CM | POA: Diagnosis not present

## 2019-01-10 LAB — CBG MONITORING, ED: Glucose-Capillary: 282 mg/dL — ABNORMAL HIGH (ref 70–99)

## 2019-01-10 LAB — COMPREHENSIVE METABOLIC PANEL
ALT: 24 U/L (ref 0–44)
AST: 30 U/L (ref 15–41)
Albumin: 2.8 g/dL — ABNORMAL LOW (ref 3.5–5.0)
Alkaline Phosphatase: 87 U/L (ref 38–126)
Anion gap: 17 — ABNORMAL HIGH (ref 5–15)
BUN: 12 mg/dL (ref 6–20)
CO2: 18 mmol/L — ABNORMAL LOW (ref 22–32)
Calcium: 8.7 mg/dL — ABNORMAL LOW (ref 8.9–10.3)
Chloride: 96 mmol/L — ABNORMAL LOW (ref 98–111)
Creatinine, Ser: 1.09 mg/dL — ABNORMAL HIGH (ref 0.44–1.00)
GFR calc Af Amer: >60 mL/min (ref >60)
GFR calc non Af Amer: 57 mL/min — ABNORMAL LOW (ref >60)
Glucose, Bld: 293 mg/dL — ABNORMAL HIGH (ref 70–99)
Potassium: 4.6 mmol/L (ref 3.5–5.1)
Sodium: 131 mmol/L — ABNORMAL LOW (ref 135–145)
Total Bilirubin: 1.6 mg/dL — ABNORMAL HIGH (ref 0.3–1.2)
Total Protein: 7.5 g/dL (ref 6.5–8.1)

## 2019-01-10 LAB — CBC WITH DIFFERENTIAL/PLATELET
Abs Immature Granulocytes: 0.09 10*3/uL — ABNORMAL HIGH (ref 0.00–0.07)
Basophils Absolute: 0 10*3/uL (ref 0.0–0.1)
Basophils Relative: 0 %
Eosinophils Absolute: 0 10*3/uL (ref 0.0–0.5)
Eosinophils Relative: 0 %
HCT: 40.5 % (ref 36.0–46.0)
Hemoglobin: 13.8 g/dL (ref 12.0–15.0)
Immature Granulocytes: 1 %
Lymphocytes Relative: 22 %
Lymphs Abs: 1.6 10*3/uL (ref 0.7–4.0)
MCH: 28 pg (ref 26.0–34.0)
MCHC: 34.1 g/dL (ref 30.0–36.0)
MCV: 82.2 fL (ref 80.0–100.0)
Monocytes Absolute: 0.6 10*3/uL (ref 0.1–1.0)
Monocytes Relative: 9 %
Neutro Abs: 4.9 10*3/uL (ref 1.7–7.7)
Neutrophils Relative %: 68 %
Platelets: 382 10*3/uL (ref 150–400)
RBC: 4.93 MIL/uL (ref 3.87–5.11)
RDW: 12.4 % (ref 11.5–15.5)
WBC: 7.2 10*3/uL (ref 4.0–10.5)
nRBC: 0 % (ref 0.0–0.2)

## 2019-01-10 LAB — D-DIMER, QUANTITATIVE: D-Dimer, Quant: 0.95 ug/mL-FEU — ABNORMAL HIGH (ref 0.00–0.50)

## 2019-01-10 LAB — FIBRINOGEN: Fibrinogen: >800 mg/dL — ABNORMAL HIGH (ref 210–475)

## 2019-01-10 LAB — FERRITIN: Ferritin: 789 ng/mL — ABNORMAL HIGH (ref 11–307)

## 2019-01-10 LAB — PROCALCITONIN

## 2019-01-10 LAB — LACTATE DEHYDROGENASE: LDH: 339 U/L — ABNORMAL HIGH (ref 98–192)

## 2019-01-10 LAB — TRIGLYCERIDES: Triglycerides: 191 mg/dL — ABNORMAL HIGH (ref <150)

## 2019-01-10 LAB — C-REACTIVE PROTEIN: CRP: 17.3 mg/dL — ABNORMAL HIGH (ref <1.0)

## 2019-01-10 LAB — LACTIC ACID, PLASMA: Lactic Acid, Venous: 1.3 mmol/L (ref 0.5–1.9)

## 2019-01-10 MED ORDER — BENZONATATE 100 MG PO CAPS: 100.0000 mg | ORAL_CAPSULE | Freq: Three times a day (TID) | ORAL | 0 refills | Status: DC

## 2019-01-10 MED ORDER — ACETAMINOPHEN 325 MG PO TABS
650.0000 mg | ORAL_TABLET | Freq: Once | ORAL | Status: AC
  Administered 2019-01-10: 17:00:00 650 mg via ORAL
  Filled 2019-01-10: qty 2

## 2019-01-10 NOTE — ED Triage Notes (Signed)
Pt from home with ems, pt tested positive for COVID 1 week ago. Pt having worsening symptoms over the weekend (SOB, cough, generalized weakness). Pt 94% on room air, 99% on 4L [place]. RR 30. Np respiratory distress noted at this time. 95% on room air upon arrival. Pt alert and oriented

## 2019-01-10 NOTE — ED Notes (Signed)
Pt ambulated in room. SPO2 93-96% on RA while ambulating.

## 2019-01-10 NOTE — ED Provider Notes (Signed)
[name] Provider Note   CSN: [id number] Arrival date & time: 01/10/19  1605     History   Chief Complaint Chief Complaint  Patient presents with  . COVID +  . Shortness of Breath  . Weakness  . Cough    HPI Mary ArcherKathy L Barrera is a 53 y.o. female.  She said she has been sick for about 10 days with Covid-like symptoms.  Ultimately she had a Covid swab done a week ago and found out 4 days ago that she was positive.  Complaining of continued sweats chills fever cough shortness of breath headache body aches nausea decreased appetite.  She has been using ibuprofen without any improvement.  She called EMS which found her satting 99 on room air.     The history is provided by the patient.  Shortness of Breath Severity:  Severe Onset quality:  Gradual Timing:  Intermittent Progression:  Worsening Chronicity:  New Context: activity   Relieved by:  Rest Worsened by:  Activity Ineffective treatments:  None tried Associated symptoms: cough, fever and headaches   Associated symptoms: no abdominal pain, no chest pain, no hemoptysis, no rash, no sore throat, no sputum production and no vomiting   Weakness Associated symptoms: cough, fever, headaches, myalgias, nausea and shortness of breath   Associated symptoms: no abdominal pain, no chest pain, no dysuria and no vomiting   Cough Associated symptoms: chills, fever, headaches, myalgias and shortness of breath   Associated symptoms: no chest pain, no rash and no sore throat     Past Medical History:  Diagnosis Date  . Anxiety   . Depression   . Diabetes mellitus without complication (HCC)   . Diabetes mellitus, type II (HCC)   . Hypertension, essential 08/19/2016  . PTSD (post-traumatic stress disorder)   . Seasonal allergies     Patient Active Problem List   Diagnosis Date Noted  . Shortness of breath 08/19/2016  . Hypertension, essential 08/19/2016    Past Surgical History:   Procedure Laterality Date  . CESAREAN SECTION       OB History   No obstetric history on file.      Home Medications    Prior to Admission medications   Medication Sig Start Date End Date Taking? Authorizing Provider  ALPRAZolam Prudy Feeler(XANAX) 0.5 MG tablet TAKE 1/2 TO 1 TABLET BY MOUTH TWICE A DAY AS NEEDED FOR ANXIETY 12/15/18   Claybon JabsHurst, Glade Nurseeresa T, PA-C  aspirin EC 81 MG tablet Take 81 mg by mouth daily.    [provider]  B Complex-C (B-COMPLEX WITH VITAMIN C) tablet Take 1 tablet by mouth daily.    [provider]  busPIRone (BUSPAR) 15 MG tablet TAKE 2 TABLETS BY MOUTH IN THE MORNING AND 1 TABLET AT BEDTIME 01/08/19   Hurst, Teresa T, PA-C  celecoxib (CELEBREX) 200 MG capsule Take 200 mg by mouth daily. 07/17/16   [provider]  desvenlafaxine (PRISTIQ) 100 MG 24 hr tablet Take 1 tablet (100 mg total) by mouth daily. 08/26/18   Melony OverlyHurst, Teresa T, PA-C  fluticasone (FLONASE) 50 MCG/ACT nasal spray Place 2 sprays into both nostrils 2 (two) times daily as needed for allergies. 05/22/16   [provider]  hydrOXYzine (ATARAX/VISTARIL) 50 MG tablet TAKE 1 TABLET (50 MG TOTAL) BY MOUTH 3 (THREE) TIMES DAILY AS NEEDED. 01/06/19   Melony OverlyHurst, Teresa T, PA-C  hydrOXYzine (VISTARIL) 50 MG capsule TK 2 CS PO QHS 06/20/18   [provider]  Insulin Glargine (BASAGLAR KWIKPEN) 100 UNIT/ML SOPN INJ 30 UNITS [place] QHS UTD 04/26/17   [provider]  JUNEL FE 1/20 1-20 MG-MCG tablet Take 1 tablet by mouth daily. 06/08/16   [provider]  LANTUS SOLOSTAR 100 UNIT/ML Solostar Pen Inject 25 Units into the skin 2 (two) times daily. 07/15/16   [provider]  ONGLYZA 5 MG TABS tablet Take 5 mg by mouth daily. 07/15/16   [provider]  OZEMPIC, 0.25 OR 0.5 MG/DOSE, 2 MG/1.5ML SOPN  07/01/18   [provider]  prazosin (MINIPRESS) 2 MG capsule TAKE 1 CAPSULE BY MOUTH AT BEDTIME. 01/04/19   Hurst, Rosey Bath T, PA-C  ramipril (ALTACE) 5 MG  capsule Take 5 mg by mouth at bedtime. 07/09/16   [provider]  simvastatin (ZOCOR) 40 MG tablet Take 40 mg by mouth at bedtime. 07/28/16   [provider]  tetrahydrozoline 0.05 % ophthalmic solution Place 1 drop into both eyes every 6 (six) hours as needed (irritated eyes).    [provider]  VITAMIN D, CHOLECALCIFEROL, PO Take 2 tablets by mouth daily.    [provider]    Family History Family History  Problem Relation Age of Onset  . CAD Mother   . Diabetes Mother   . Colon cancer Mother   . Stomach cancer Father     Social History Social History   Tobacco Use  . Smoking status: Never Smoker  . Smokeless tobacco: Never Used  Substance Use Topics  . Alcohol use: Not Currently  . Drug use: Not Currently    Types: Marijuana     Allergies   Atorvastatin, Oxycodone, Tramadol, and Vicodin [hydrocodone-acetaminophen]   Review of Systems Review of Systems  Constitutional: Positive for chills and fever.  HENT: Negative for sore throat.   Eyes: Negative for visual disturbance.  Respiratory: Positive for cough and shortness of breath. Negative for hemoptysis and sputum production.   Cardiovascular: Negative for chest pain.  Gastrointestinal: Positive for nausea. Negative for abdominal pain and vomiting.  Genitourinary: Negative for dysuria.  Musculoskeletal: Positive for myalgias.  Skin: Negative for rash.  Neurological: Positive for weakness and headaches.     Physical Exam Updated Vital Signs BP 134/85   Pulse 97   Temp (!) 101.4 F (38.6 C) (Oral)   Resp (!) 21   Ht 5\' 6"  (1.676 m)   Wt 85.7 kg   SpO2 99%   BMI 30.51 kg/m   Physical Exam Vitals signs and nursing note reviewed.  Constitutional:      General: She is not in acute distress.    Appearance: She is well-developed.  HENT:     Head: Normocephalic and atraumatic.  Eyes:     Conjunctiva/sclera: Conjunctivae normal.  Neck:     Musculoskeletal: Neck supple.   Cardiovascular:     Rate and Rhythm: Normal rate and regular rhythm.     Heart sounds: No murmur.  Pulmonary:     Effort: Pulmonary effort is normal. No respiratory distress.     Breath sounds: Normal breath sounds. Rhonchi: .edcov.  Abdominal:     Palpations: Abdomen is soft.     Tenderness: There is no abdominal tenderness.  Musculoskeletal: Normal range of motion.     Right lower leg: She exhibits no tenderness. No edema.     Left lower leg: She exhibits no tenderness. No edema.  Skin:    General: Skin is warm and dry.     Capillary Refill: Capillary refill  takes less than 2 seconds.  Neurological:     General: No focal deficit present.     Mental Status: She is alert and oriented to person, place, and time.      ED Treatments / Results  Labs (all labs ordered are listed, but only abnormal results are displayed) Labs Reviewed  CBC WITH DIFFERENTIAL/PLATELET - Abnormal; Notable for the following components:      Result Value   Abs Immature Granulocytes 0.09 (*)    All other components within normal limits  COMPREHENSIVE METABOLIC PANEL - Abnormal; Notable for the following components:   Sodium 131 (*)    Chloride 96 (*)    CO2 18 (*)    Glucose, Bld 293 (*)    Creatinine, Ser 1.09 (*)    Calcium 8.7 (*)    Albumin 2.8 (*)    Total Bilirubin 1.6 (*)    GFR calc non Af Amer 57 (*)    Anion gap 17 (*)    All other components within normal limits  LACTATE DEHYDROGENASE - Abnormal; Notable for the following components:   LDH 339 (*)    All other components within normal limits  FERRITIN - Abnormal; Notable for the following components:   Ferritin 789 (*)    All other components within normal limits  C-REACTIVE PROTEIN - Abnormal; Notable for the following components:   CRP 17.3 (*)    All other components within normal limits  TRIGLYCERIDES - Abnormal; Notable for the following components:   Triglycerides 191 (*)    All other components within normal limits   D-DIMER, QUANTITATIVE (NOT AT [facility]) - Abnormal; Notable for the following components:   D-Dimer, Quant 0.95 (*)    All other components within normal limits  FIBRINOGEN - Abnormal; Notable for the following components:   Fibrinogen >800 (*)    All other components within normal limits  CBG MONITORING, ED - Abnormal; Notable for the following components:   Glucose-Capillary 282 (*)    All other components within normal limits  CULTURE, BLOOD (ROUTINE X 2)  LACTIC ACID, PLASMA  PROCALCITONIN    EKG EKG Interpretation  Date/Time:  Tuesday January 10 2019 16:42:36 EDT Ventricular Rate:  95 PR Interval:    QRS Duration: 71 QT Interval:  334 QTC Calculation: 420 R Axis:   23 Text Interpretation:  Sinus rhythm No old tracing to compare Confirmed by Aletta Edouard [phone]) on 01/10/2019 4:53:18 PM   Radiology Dg Chest Port 1 View  Result Date: 01/10/2019 CLINICAL DATA:  53 year old female with shortness of breath. EXAM: PORTABLE CHEST 1 VIEW COMPARISON:  Chest radiograph dated 08/12/2016 FINDINGS: The lungs are clear. There is no pleural effusion pneumothorax. The cardiac silhouette is within normal limits. No acute osseous pathology. IMPRESSION: No active disease. Electronically Signed   By: Anner Crete M.D.   On: 01/10/2019 17:14    Procedures Procedures (including critical care time)  Medications Ordered in ED Medications  acetaminophen (TYLENOL) tablet 650 mg (650 mg Oral Given 01/10/19 1727)     Initial Impression / Assessment and Plan / ED Course  I have reviewed the triage vital signs and the nursing notes.  Pertinent labs & imaging results that were available during my care of the patient were reviewed by me and considered in my medical decision making (see chart for details).  Clinical Course as of Jan 10 1000  Tue Oct 13, 327  3771 53 year old female here known Covid positive with symptoms for about 10 days here  with increasing shortness of breath.  Sats 95  to 99% on room air.  She is dyspneic although speaking in full sentences.  Getting labs chest x-ray.  Likely discharge   [MB]  1722 Chest x-ray reviewed by me no gross infiltrates.   [MB]  1909 Patient's labs show her inflammatory markers to be elevated although remains satting well on room air.  We will try to do a little bit of a trending pulse ox in the room but I think ultimately she is probably discharge and close follow-up with good return instructions.   [MB]    Clinical Course User Index [MB] Terrilee Files, MD   Mary Barrera was evaluated in Emergency Department on 01/10/2019 for the symptoms described in the history of present illness. She was evaluated in the context of the global COVID-19 pandemic, which necessitated consideration that the patient might be at risk for infection with the SARS-CoV-2 virus that causes COVID-19. Institutional protocols and algorithms that pertain to the evaluation of patients at risk for COVID-19 are in a state of rapid change based on information released by regulatory bodies including the CDC and federal and state organizations. These policies and algorithms were followed during the patient's care in the ED.      Final Clinical Impressions(s) / ED Diagnoses   Final diagnoses:  COVID-19 virus infection  SOB (shortness of breath)    ED Discharge Orders    None       Terrilee Files, MD 01/11/19 1001

## 2019-01-10 NOTE — Discharge Instructions (Signed)
You were seen in the emergency department for worsening shortness of breath and other Covid symptoms.  Your x-ray did not show any obvious pneumonia and your oxygen level is stable here.  Will be important for you to continue Tylenol as needed for aches and pains and fever control and keep well-hydrated.  Please return to the emergency department if any worsening shortness of breath or other concerning symptoms.

## 2019-01-10 NOTE — ED Notes (Signed)
Patient verbalizes understanding of discharge instructions. Opportunity for questioning and answers were provided. Armband removed by staff, pt discharged from ED ambulatory.   

## 2019-01-15 LAB — CULTURE, BLOOD (ROUTINE X 2)
Culture: NO GROWTH
Special Requests: ADEQUATE

## 2019-01-17 DIAGNOSIS — U071 COVID-19: Secondary | ICD-10-CM | POA: Diagnosis not present

## 2019-01-17 DIAGNOSIS — J209 Acute bronchitis, unspecified: Secondary | ICD-10-CM | POA: Diagnosis not present

## 2019-01-19 ENCOUNTER — Ambulatory Visit: Payer: BC Managed Care – PPO | Admitting: Physician Assistant

## 2019-01-26 DIAGNOSIS — R0602 Shortness of breath: Secondary | ICD-10-CM | POA: Diagnosis not present

## 2019-01-26 DIAGNOSIS — R531 Weakness: Secondary | ICD-10-CM | POA: Diagnosis not present

## 2019-01-27 ENCOUNTER — Other Ambulatory Visit (HOSPITAL_COMMUNITY): Payer: Self-pay | Admitting: Family Medicine

## 2019-01-27 ENCOUNTER — Other Ambulatory Visit: Payer: Self-pay | Admitting: Family Medicine

## 2019-01-27 ENCOUNTER — Encounter (HOSPITAL_COMMUNITY): Payer: Self-pay

## 2019-01-27 ENCOUNTER — Ambulatory Visit (HOSPITAL_COMMUNITY): Admission: RE | Admit: 2019-01-27 | Payer: BC Managed Care – PPO | Source: Ambulatory Visit

## 2019-01-27 DIAGNOSIS — R0602 Shortness of breath: Secondary | ICD-10-CM

## 2019-01-28 ENCOUNTER — Ambulatory Visit (HOSPITAL_COMMUNITY)
Admission: RE | Admit: 2019-01-28 | Discharge: 2019-01-28 | Disposition: A | Discharge status: Home / Self Care | Payer: BC Managed Care – PPO | Attending: Family Medicine | Admitting: Family Medicine

## 2019-01-28 ENCOUNTER — Ambulatory Visit (HOSPITAL_COMMUNITY)
Admission: RE | Admit: 2019-01-28 | Discharge: 2019-01-28 | Disposition: A | Discharge status: Home / Self Care | Payer: BC Managed Care – PPO | Source: Ambulatory Visit | Attending: Family Medicine | Admitting: Family Medicine

## 2019-01-28 DIAGNOSIS — R0602 Shortness of breath: Secondary | ICD-10-CM | POA: Insufficient documentation

## 2019-01-28 MED ORDER — IOHEXOL 350 MG/ML SOLN
100.0000 mL | Freq: Once | INTRAVENOUS | Status: AC | PRN
  Administered 2019-01-28: 57 mL via INTRAVENOUS

## 2019-01-30 ENCOUNTER — Encounter: Payer: Self-pay | Admitting: Neurology

## 2019-02-01 ENCOUNTER — Other Ambulatory Visit: Payer: Self-pay | Admitting: Physician Assistant

## 2019-02-02 ENCOUNTER — Other Ambulatory Visit: Payer: Self-pay | Admitting: Physician Assistant

## 2019-02-06 ENCOUNTER — Ambulatory Visit: Payer: BC Managed Care – PPO | Admitting: Neurology
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
## 2019-02-08 ENCOUNTER — Other Ambulatory Visit: Payer: Self-pay

## 2019-02-08 ENCOUNTER — Ambulatory Visit: Payer: BC Managed Care – PPO | Admitting: Neurology

## 2019-02-16 ENCOUNTER — Telehealth: Payer: Self-pay | Admitting: Physician Assistant

## 2019-02-16 ENCOUNTER — Other Ambulatory Visit: Payer: Self-pay

## 2019-02-16 MED ORDER — DESVENLAFAXINE SUCCINATE ER 100 MG PO TB24: 100.0000 mg | ORAL_TABLET | Freq: Every day | ORAL | 0 refills | Status: DC

## 2019-02-16 NOTE — Telephone Encounter (Signed)
Pt would like a refill on her Desvenlafaxine. Pt has appt Monday, but will be out by then. Please send [facility] day supply to [facility] on [address].

## 2019-02-16 NOTE — Telephone Encounter (Signed)
Refill submitted for Pristiq 100 mg #30 to [facility] [place] [id number]

## 2019-02-20 ENCOUNTER — Ambulatory Visit (INDEPENDENT_AMBULATORY_CARE_PROVIDER_SITE_OTHER): Payer: BC Managed Care – PPO | Admitting: Physician Assistant

## 2019-02-20 ENCOUNTER — Encounter: Payer: Self-pay | Admitting: Physician Assistant

## 2019-02-20 ENCOUNTER — Other Ambulatory Visit: Payer: Self-pay

## 2019-02-20 DIAGNOSIS — F411 Generalized anxiety disorder: Secondary | ICD-10-CM

## 2019-02-20 DIAGNOSIS — F4310 Post-traumatic stress disorder, unspecified: Secondary | ICD-10-CM | POA: Diagnosis not present

## 2019-02-20 DIAGNOSIS — F331 Major depressive disorder, recurrent, moderate: Secondary | ICD-10-CM

## 2019-02-20 MED ORDER — ALPRAZOLAM 0.5 MG PO TABS: 0.5000 mg | ORAL_TABLET | Freq: Two times a day (BID) | ORAL | 5 refills | Status: DC | PRN

## 2019-02-20 MED ORDER — DESVENLAFAXINE SUCCINATE ER 100 MG PO TB24: 100.0000 mg | ORAL_TABLET | Freq: Every day | ORAL | 5 refills | Status: DC

## 2019-02-20 MED ORDER — PRAZOSIN HCL 2 MG PO CAPS: 2.0000 mg | ORAL_CAPSULE | Freq: Every day | ORAL | 5 refills | Status: DC

## 2019-02-20 MED ORDER — BUSPIRONE HCL 15 MG PO TABS: ORAL_TABLET | ORAL | 5 refills | Status: DC

## 2019-02-20 NOTE — Progress Notes (Signed)
[facility]  Patient ID: Mary Barrera,  MRN: [id number]  PCP: Mary Jordan, MD  Date of Evaluation: 02/20/2019 Time spent:15 minutes  Chief Complaint:  Chief Complaint    Follow-up      HISTORY/CURRENT STATUS: HPI for 79-month med check.  Patient states that she is doing quite a bit better right now.  She still suffers from anxiety, which seems to be the biggest problem at this point.  Sometimes she needs to take the Xanax more than once a day and then she ends up running out earlier than expected.  We had tried to increase the BuSpar at the last visit but she did not respond well.  States it did not help either.  Since her last visit, her sister died.  Mary Barrera has been very sad, understandably.  She was having passive suicidal ideations for a few weeks but those thoughts resolved.  "I would never do anything to hurt myself but losing my sister was hard and I was just really sad.  I am not having any suicidal thoughts at all right now."  The prazosin continues to help prevent nightmares secondary to the PTSD.  Patient is still not working.  Reports that she had increased anxiety and depression when she was working.  Feels that she needs to be on disability and is attempting that.  Patient denies loss of interest in usual activities and is able to enjoy things.  Denies decreased energy or motivation.  Appetite has not changed.  No extreme sadness, tearfulness, or feelings of hopelessness.  Denies any changes in concentration, making decisions or remembering things.  Denies suicidal or homicidal thoughts.  Patient denies increased energy with decreased need for sleep, no increased talkativeness, no racing thoughts, no impulsivity or risky behaviors, no increased spending, no increased libido, no grandiosity.  Denies dizziness, syncope, seizures, numbness, tingling, tremor, tics, unsteady gait, slurred speech, confusion. Denies muscle or joint pain, stiffness, or  dystonia.  Individual Medical History/ Review of Systems: Changes? :Yes She had COVID-19 since the last visit.  She was very ill and thought she was dying.  She is much better now.  Past medications for mental health diagnoses include: Gabapentin for back pain caused tremor, Klonopin helped but caused memory issues, Celexa,and 1 other that she cannot remember. States Pristiq has been the best thing that she has taken.  Allergies: Atorvastatin, Oxycodone, Tramadol, and Vicodin [hydrocodone-acetaminophen]  Current Medications:  Current Outpatient Medications:  .  ALPRAZolam (XANAX) 0.5 MG tablet, Take 1 tablet (0.5 mg total) by mouth 2 (two) times daily as needed for anxiety., Disp: 60 tablet, Rfl: 5 .  aspirin EC 81 MG tablet, Take 81 mg by mouth daily., Disp: , Rfl:  .  B Complex-C (B-COMPLEX WITH VITAMIN C) tablet, Take 1 tablet by mouth daily., Disp: , Rfl:  .  busPIRone (BUSPAR) 15 MG tablet, TAKE 1 TABLET BY MOUTH 2 TIMES DAILY., Disp: 60 tablet, Rfl: 5 .  celecoxib (CELEBREX) 200 MG capsule, Take 200 mg by mouth daily., Disp: , Rfl: 5 .  desvenlafaxine (PRISTIQ) 100 MG 24 hr tablet, Take 1 tablet (100 mg total) by mouth daily., Disp: 30 tablet, Rfl: 5 .  fluticasone (FLONASE) 50 MCG/ACT nasal spray, Place 2 sprays into both nostrils 2 (two) times daily as needed for allergies., Disp: , Rfl: 3 .  hydrOXYzine (ATARAX/VISTARIL) 50 MG tablet, TAKE 1 TABLET (50 MG TOTAL) BY MOUTH 3 (THREE) TIMES DAILY AS NEEDED., Disp: 270 tablet, Rfl: 2 .  hydrOXYzine (  VISTARIL) 50 MG capsule, TK 2 CS PO QHS, Disp: , Rfl:  .  Insulin Glargine (BASAGLAR KWIKPEN) 100 UNIT/ML SOPN, INJ 30 UNITS [place] QHS UTD, Disp: , Rfl:  .  LANTUS SOLOSTAR 100 UNIT/ML Solostar Pen, Inject 25 Units into the skin 2 (two) times daily., Disp: , Rfl: 5 .  OZEMPIC, 0.25 OR 0.5 MG/DOSE, 2 MG/1.5ML SOPN, , Disp: , Rfl:  .  prazosin (MINIPRESS) 2 MG capsule, Take 1 capsule (2 mg total) by mouth at bedtime., Disp: 30 capsule, Rfl: 5 .   ramipril (ALTACE) 5 MG capsule, Take 5 mg by mouth at bedtime., Disp: , Rfl: 4 .  rosuvastatin (CRESTOR) 10 MG tablet, , Disp: , Rfl:  .  VITAMIN D, CHOLECALCIFEROL, PO, Take 2 tablets by mouth daily., Disp: , Rfl:  .  benzonatate (TESSALON) 100 MG capsule, Take 1 capsule (100 mg total) by mouth every 8 (eight) hours. (Patient not taking: Reported on 02/20/2019), Disp: 21 capsule, Rfl: 0 .  JUNEL FE 1/20 1-20 MG-MCG tablet, Take 1 tablet by mouth daily., Disp: , Rfl: 1 .  ONGLYZA 5 MG TABS tablet, Take 5 mg by mouth daily., Disp: , Rfl: 5 .  simvastatin (ZOCOR) 40 MG tablet, Take 40 mg by mouth at bedtime., Disp: , Rfl: 1 .  tetrahydrozoline 0.05 % ophthalmic solution, Place 1 drop into both eyes every 6 (six) hours as needed (irritated eyes)., Disp: , Rfl:  Medication Side Effects: none  Family Medical/ Social History: Changes? No  MENTAL HEALTH EXAM:  There were no vitals taken for this visit.There is no height or weight on file to calculate BMI.  General Appearance: unable to assess  Eye Contact:  Good  Speech:  Clear and Coherent  Volume:  Normal  Mood:  Euthymic  Affect:  unable to assess  Thought Process:  Goal Directed and Descriptions of Associations: Intact  Orientation:  Full (Time, Place, and Person)  Thought Content: Logical   Suicidal Thoughts:  No  Homicidal Thoughts:  No  Memory:  WNL  Judgement:  Good  Insight:  Good  Psychomotor Activity:  Unable to assess  Concentration:  Concentration: Good  Recall:  Good  Fund of Knowledge: Good  Language: Good  Assets:  Desire for Improvement  ADL's:  Intact  Cognition: WNL  Prognosis:  Good    DIAGNOSES:    ICD-10-CM   1. Major depressive disorder, recurrent episode, moderate (HCC)  F33.1   2. PTSD (post-traumatic stress disorder)  F43.10   3. Generalized anxiety disorder  F41.1     Receiving Psychotherapy: Yes  Mary Barrera   RECOMMENDATIONS:  I am glad that she is feeling better with the anxiety and  depression, and having no nightmares at the present time. Continue Xanax 0.5 mg, 1/2-1 p.o. twice daily as needed. Continue BuSpar 15 mg 1 p.o. twice daily. Continue Pristiq 100 mg daily. Continue hydroxyzine 50 mg, 1 3 times daily as needed. Continue therapy with Mary Barrera. Return in 6 months.  Melony Overly, PA-C

## 2019-02-27 ENCOUNTER — Encounter: Payer: Self-pay | Admitting: *Deleted

## 2019-02-28 ENCOUNTER — Other Ambulatory Visit: Payer: Self-pay

## 2019-02-28 ENCOUNTER — Encounter: Payer: Self-pay | Admitting: Diagnostic Neuroimaging

## 2019-02-28 ENCOUNTER — Ambulatory Visit: Payer: BC Managed Care – PPO | Admitting: Diagnostic Neuroimaging

## 2019-02-28 VITALS — BP 110/73 | HR 87 | Temp 97.0°F | Ht 66.0 in | Wt 207.2 lb

## 2019-02-28 DIAGNOSIS — G9331 Postviral fatigue syndrome: Secondary | ICD-10-CM

## 2019-02-28 DIAGNOSIS — G933 Postviral fatigue syndrome: Secondary | ICD-10-CM
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
## 2019-05-29 ENCOUNTER — Other Ambulatory Visit: Payer: Self-pay | Admitting: Physician Assistant

## 2019-05-29 NOTE — Telephone Encounter (Signed)
Last visit has 1 bid?

## 2019-07-17 ENCOUNTER — Telehealth: Payer: Self-pay | Admitting: Physician Assistant

## 2019-07-17 ENCOUNTER — Other Ambulatory Visit: Payer: Self-pay

## 2019-07-17 MED ORDER — DESVENLAFAXINE SUCCINATE ER 100 MG PO TB24: 100.0000 mg | ORAL_TABLET | Freq: Every day | ORAL | 1 refills | Status: DC

## 2019-07-17 NOTE — Telephone Encounter (Signed)
Pt is requesting a refill on her Pristiq. She states pharmcy has sent multi requests with no response from [place]. Fill at the [facility] on [address]. Next appt scheduled for 5/24.

## 2019-07-17 NOTE — Telephone Encounter (Signed)
We have not received any requests from her pharmacy but we can send in refills until her next scheduled apt

## 2019-08-08 ENCOUNTER — Ambulatory Visit (INDEPENDENT_AMBULATORY_CARE_PROVIDER_SITE_OTHER): Payer: 59

## 2019-08-08 ENCOUNTER — Other Ambulatory Visit: Payer: Self-pay

## 2019-08-08 ENCOUNTER — Ambulatory Visit (INDEPENDENT_AMBULATORY_CARE_PROVIDER_SITE_OTHER): Payer: 59 | Admitting: Sports Medicine

## 2019-08-08 ENCOUNTER — Encounter: Payer: Self-pay | Admitting: Sports Medicine

## 2019-08-08 ENCOUNTER — Other Ambulatory Visit: Payer: Self-pay | Admitting: Sports Medicine

## 2019-08-08 DIAGNOSIS — S9031XA Contusion of right foot, initial encounter: Secondary | ICD-10-CM

## 2019-08-08 DIAGNOSIS — E119 Type 2 diabetes mellitus without complications: Secondary | ICD-10-CM

## 2019-08-08 DIAGNOSIS — M7661 Achilles tendinitis, right leg: Secondary | ICD-10-CM | POA: Diagnosis not present

## 2019-08-08 DIAGNOSIS — M7662 Achilles tendinitis, left leg: Secondary | ICD-10-CM | POA: Diagnosis not present

## 2019-08-08 DIAGNOSIS — M79672 Pain in left foot: Secondary | ICD-10-CM | POA: Diagnosis not present

## 2019-08-08 MED ORDER — METHYLPREDNISOLONE 4 MG PO TBPK: ORAL_TABLET | ORAL | 0 refills | Status: DC

## 2019-08-08 NOTE — Progress Notes (Signed)
Subjective: Mary Barrera is a 54 y.o. female patient who presents to office for evaluation of left heel pain. Patient complains of progressive pain especially over the last month at the back of the heel reports that she does a lot of walking and standing and slowly the back of the left heel has become painful reports that she feels swelling and a pulling sensation when she is going to walk has tried miracle cream and a brace that has not helped much.  Patient denies any injury or trauma to the area patient denies any other pedal complaints.   Review of Systems  All other systems reviewed and are negative.  Patient Active Problem List   Diagnosis Date Noted  . Shortness of breath 08/19/2016  . Hypertension, essential 08/19/2016    Current Outpatient Medications on File Prior to Visit  Medication Sig Dispense Refill  . ALPRAZolam (XANAX) 0.5 MG tablet Take 1 tablet (0.5 mg total) by mouth 2 (two) times daily as needed for anxiety. 60 tablet 5  . aspirin EC 81 MG tablet Take 81 mg by mouth daily.    . busPIRone (BUSPAR) 15 MG tablet TAKE 1 TABLET BY MOUTH 2 TIMES DAILY. 60 tablet 5  . celecoxib (CELEBREX) 200 MG capsule Take 200 mg by mouth daily.  5  . desvenlafaxine (PRISTIQ) 100 MG 24 hr tablet Take 1 tablet (100 mg total) by mouth daily. 30 tablet 1  . fluticasone (FLONASE) 50 MCG/ACT nasal spray Place 2 sprays into both nostrils 2 (two) times daily as needed for allergies.  3  . hydrOXYzine (ATARAX/VISTARIL) 50 MG tablet TAKE 1 TABLET (50 MG TOTAL) BY MOUTH 3 (THREE) TIMES DAILY AS NEEDED. 270 tablet 2  . Insulin Glargine (BASAGLAR KWIKPEN) 100 UNIT/ML SOPN INJ 30 UNITS [place] QHS UTD    . NOREL AD 4-10-325 MG TABS Take 1 tablet by mouth every 8 (eight) hours as needed.    Marland Kitchen OZEMPIC, 0.25 OR 0.5 MG/DOSE, 2 MG/1.5ML SOPN     . prazosin (MINIPRESS) 2 MG capsule Take 1 capsule (2 mg total) by mouth at bedtime. 30 capsule 5  . ramipril (ALTACE) 5 MG capsule Take 5 mg by mouth at bedtime.  4  .  rosuvastatin (CRESTOR) 10 MG tablet     . SYMBICORT 160-4.5 MCG/ACT inhaler SMARTSIG:2 Puff(s) By Mouth Twice Daily    . VITAMIN D, CHOLECALCIFEROL, PO Take 2 tablets by mouth daily.     No current facility-administered medications on file prior to visit.    Allergies  Allergen Reactions  . Humalog [Insulin Lispro] Shortness Of Breath  . Pristiq [Desvenlafaxine] Shortness Of Breath  . Atorvastatin Other (See Comments)    Myalgias  . Hydrocodone-Acetaminophen Other (See Comments)    Patient's head feels tight   . Oxycodone Itching  . Simvastatin Itching  . Tramadol Itching    Objective:  General: Alert and oriented x3 in no acute distress  Dermatology: No open lesions bilateral lower extremities, no webspace macerations, no ecchymosis bilateral, all nails x 10 are well manicured.  Vascular: Dorsalis Pedis and Posterior Tibial pedal pulses 1/4, Capillary Fill Time 3 seconds, + pedal hair growth bilateral, no edema bilateral lower extremities, Temperature gradient within normal limits.  Neurology: Johney Maine sensation intact via light touch bilateral.   Musculoskeletal: Mild to moderate tenderness with palpation at insertion of the Achilles on Left, there is minimal calcaneal exostosis with mild soft tissue swelling present and decreased ankle rom with knee extending  vs flexed resembling gastroc  equnius bilateral, The achilles tendon feels intact with no nodularity or palpable dell, Thompson sign negative on the left.  Gait: Antalgic gait with increased heel off on the left.  Xrays  Left Foot    Impression: Normal osseous mineralization. Joint spaces preserved. No fracture/dislocation/boney destruction. Calcaneal spur present. Kager's triangle intact with no obliteration. No soft tissue abnormalities or radiopaque foreign bodies.   Assessment and Plan: Problem List Items Addressed This Visit    None    Visit Diagnoses    Tendonitis, Achilles, left    -  Primary   Relevant  Medications   methylPREDNISolone (MEDROL DOSEPAK) 4 MG TBPK tablet   Pain of left heel       Relevant Orders   DG Foot Complete Right   Diabetes mellitus without complication (HCC)          -Complete examination performed -Xrays reviewed -Discussed treatement options for likely Achilles tendinitis -Rx CAM boot -Rx low-dose Medrol Dosepak and advised patient on impact of blood sugars while on this medication -Advised patient to ice twice daily until symptoms improve -No improvement will consider MRI/PT/EPAT -Patient to return to office in 2 to 3 weeks or sooner if condition worsens.  Asencion Islam, DPM

## 2019-08-08 NOTE — Patient Instructions (Signed)
[employer]'s Emergency Medicine: Concepts and Clinical Practice (9th ed., pp. 1392-1401). [place], [place]: [employer]. Retrieved from https://www.clinicalkey.com/#[email or web address]  Achilles Tendinitis  Achilles tendinitis is inflammation of the tough, cord-like band that attaches the lower leg muscles to the heel bone (Achilles tendon). This is usually caused by overusing the tendon and the ankle joint. Achilles tendinitis usually gets better over time with treatment and caring for yourself at home. It can take weeks or months to heal completely. What are the causes? This condition may be caused by:  A sudden increase in exercise or activity, such as running.  Doing the same exercises or activities, such as jumping, over and over.  Not warming up calf muscles before exercising.  Exercising in shoes that are worn out or not made for exercise.  Having arthritis or a bone growth (spur) on the back of the heel bone. This can rub against the tendon and hurt it.  Age-related wear and tear. Tendons become less flexible with age and are more likely to be injured. What are the signs or symptoms? Common symptoms of this condition include:  Pain in the Achilles tendon or in the back of the leg, just above the heel. The pain usually gets worse with exercise.  Stiffness or soreness in the back of the leg, especially in the morning.  Swelling of the skin over the Achilles tendon.  Thickening of the tendon.  Trouble standing on tiptoe. How is this diagnosed? This condition is diagnosed based on your symptoms and a physical exam. You may have tests, including:  X-rays.  MRI. How is this treated? The goal of treatment is to relieve symptoms and help your injury heal. Treatment may include:  Decreasing or stopping activities that caused the tendinitis. This may mean switching to low-impact exercises like biking or swimming.  Icing the injured  area.  Doing physical therapy, including strengthening and stretching exercises.  Taking NSAIDs, such as ibuprofen, to help relieve pain and swelling.  Using supportive shoes, wraps, heel lifts, or a walking boot (air cast).  Having surgery. This may be done if your symptoms do not improve after other treatments.  Using high-energy shock wave impulses to stimulate the healing process (extracorporeal shock wave therapy). This is rare.  Having an injection of medicines that help relieve inflammation (corticosteroids). This is rare. Follow these instructions at home: If you have an air cast:  Wear the air cast as told by your health care provider. Remove it only as told by your health care provider.  Loosen it if your toes tingle, become numb, or turn cold and blue.  Keep it clean.  If the air cast is not waterproof: ? Do not let it get wet. ? Cover it with a watertight covering when you take a bath or shower. Managing pain, stiffness, and swelling   If directed, put ice on the injured area. To do this: ? If you have a removable air cast, remove it as told by your health care provider. ? Put ice in a plastic bag. ? Place a towel between your skin and the bag. ? Leave the ice on for 20 minutes, 2-3 times a day.  Move your toes often to reduce stiffness and swelling.  Raise (elevate) your foot above the level of your heart while you are sitting or lying down. Activity  Gradually return to your normal activities as told by your health care provider. Ask your health care provider what activities are safe for you.  Do not do   activities that cause pain.  Consider doing low-impact exercises, like cycling or swimming.  Ask your health care provider when it is safe to drive if you have an air cast on your foot.  If physical therapy was prescribed, do exercises as told by your health care provider or physical therapist. General instructions  If directed, wrap your foot with an  elastic bandage or other wrap. This can help to keep your tendon from moving too much while it heals. Your health care provider will show you how to wrap your foot correctly.  Wear supportive shoes or heel lifts only as told by your health care provider.  Take over-the-counter and prescription medicines only as told by your health care provider.  Keep all follow-up visits as told by your health care provider. This is important. Contact a health care provider if you:  Have symptoms that get worse.  Have pain that does not get better with medicine.  Develop new, unexplained symptoms.  Develop warmth and swelling in your foot.  Have a fever. Get help right away if you:  Have a sudden popping sound or sensation in your Achilles tendon followed by severe pain.  Cannot move your toes or foot.  Cannot put any weight on your foot.  Your foot or toes become numb and look white or blue even after loosening your bandage or air cast. Summary  Achilles tendinitis is inflammation of the tough, cord-like band that attaches the lower leg muscles to the heel bone (Achilles tendon).  This condition is usually caused by overusing the tendon and the ankle joint. It can also be caused by arthritis or normal aging.  The most common symptoms of this condition include pain, swelling, or stiffness in the Achilles tendon or in the back of the leg.  This condition is usually treated by decreasing or stopping activities that caused the tendinitis, icing the injured area, taking NSAIDs, and doing physical therapy. This information is not intended to replace advice given to you by your health care provider. Make sure you discuss any questions you have with your health care provider. Document Revised: 08/01/2018 Document Reviewed: 08/01/2018 Elsevier Patient Education  2020 [employer].  

## 2019-08-21 ENCOUNTER — Ambulatory Visit: Payer: BC Managed Care – PPO | Admitting: Physician Assistant

## 2019-08-22 ENCOUNTER — Encounter: Payer: Self-pay | Admitting: Physician Assistant

## 2019-08-22 ENCOUNTER — Ambulatory Visit (INDEPENDENT_AMBULATORY_CARE_PROVIDER_SITE_OTHER): Payer: 59 | Admitting: Physician Assistant

## 2019-08-22 ENCOUNTER — Other Ambulatory Visit: Payer: Self-pay

## 2019-08-22 DIAGNOSIS — F4310 Post-traumatic stress disorder, unspecified: Secondary | ICD-10-CM | POA: Diagnosis not present

## 2019-08-22 DIAGNOSIS — F411 Generalized anxiety disorder: Secondary | ICD-10-CM | POA: Diagnosis not present

## 2019-08-22 DIAGNOSIS — F331 Major depressive disorder, recurrent, moderate: Secondary | ICD-10-CM | POA: Diagnosis not present

## 2019-08-22 MED ORDER — ALPRAZOLAM 0.5 MG PO TABS: 0.5000 mg | ORAL_TABLET | Freq: Three times a day (TID) | ORAL | 1 refills | Status: DC | PRN

## 2019-08-22 MED ORDER — DESVENLAFAXINE SUCCINATE ER 50 MG PO TB24: 150.0000 mg | ORAL_TABLET | Freq: Every day | ORAL | 1 refills | Status: DC

## 2019-08-22 MED ORDER — BUSPIRONE HCL 30 MG PO TABS: 30.0000 mg | ORAL_TABLET | Freq: Two times a day (BID) | ORAL | 1 refills | Status: DC

## 2019-08-22 NOTE — Progress Notes (Signed)
[facility]  Patient ID: Mary Barrera,  MRN: [id number]  PCP: Mila Palmer, MD  Date of Evaluation: 08/22/2019 Time spent:20 minutes  Chief Complaint:  Chief Complaint    Anxiety      HISTORY/CURRENT STATUS: HPI For routine med check.   A lot of stress at home. Her adult son is dealing with the same problems she is with depression and anxiety. He is out of the house, but he's manipulative, and needing money, won't get help.  She has been more anxious about that.  Thankfully the Xanax and hydroxyzine help.  Also kind of blue because of everything that is going on.  She does enjoy things when she has the opportunity.  Energy and motivation are low though.  Does not cry easily.  Not isolating.  No SI/HI.  Patient denies increased energy with decreased need for sleep, no increased talkativeness, no racing thoughts, no impulsivity or risky behaviors, no increased spending, no increased libido, no grandiosity, no increased irritability or anger, and no hallucinations.  Denies dizziness, syncope, seizures, numbness, tingling, tremor, tics, unsteady gait, slurred speech, confusion. Denies muscle or joint pain, stiffness, or dystonia.  Individual Medical History/ Review of Systems: Changes? :Yes  acchiles tendonitis   Past medications for mental health diagnoses include: Gabapentin for back pain caused tremor, Klonopin helped but caused memory issues, Celexa,and 1 other that she cannot remember. States Pristiq has been the best thing that she has taken.  Allergies: Humalog [insulin lispro], Atorvastatin, Hydrocodone-acetaminophen, Oxycodone, Simvastatin, and Tramadol  Current Medications:  Current Outpatient Medications:  .  ALPRAZolam (XANAX) 0.5 MG tablet, Take 1 tablet (0.5 mg total) by mouth 3 (three) times daily as needed for anxiety., Disp: 90 tablet, Rfl: 1 .  aspirin EC 81 MG tablet, Take 81 mg by mouth daily., Disp: , Rfl:  .  celecoxib (CELEBREX) 200 MG  capsule, Take 200 mg by mouth daily., Disp: , Rfl: 5 .  fluticasone (FLONASE) 50 MCG/ACT nasal spray, Place 2 sprays into both nostrils 2 (two) times daily as needed for allergies., Disp: , Rfl: 3 .  hydrOXYzine (ATARAX/VISTARIL) 50 MG tablet, TAKE 1 TABLET (50 MG TOTAL) BY MOUTH 3 (THREE) TIMES DAILY AS NEEDED., Disp: 270 tablet, Rfl: 2 .  Insulin Glargine (BASAGLAR KWIKPEN) 100 UNIT/ML SOPN, INJ 30 UNITS [place] QHS UTD, Disp: , Rfl:  .  NOREL AD 4-10-325 MG TABS, Take 1 tablet by mouth every 8 (eight) hours as needed., Disp: , Rfl:  .  OZEMPIC, 0.25 OR 0.5 MG/DOSE, 2 MG/1.5ML SOPN, , Disp: , Rfl:  .  prazosin (MINIPRESS) 2 MG capsule, Take 1 capsule (2 mg total) by mouth at bedtime., Disp: 30 capsule, Rfl: 5 .  ramipril (ALTACE) 5 MG capsule, Take 5 mg by mouth at bedtime., Disp: , Rfl: 4 .  rosuvastatin (CRESTOR) 10 MG tablet, , Disp: , Rfl:  .  SYMBICORT 160-4.5 MCG/ACT inhaler, SMARTSIG:2 Puff(s) By Mouth Twice Daily, Disp: , Rfl:  .  VITAMIN D, CHOLECALCIFEROL, PO, Take 2 tablets by mouth daily., Disp: , Rfl:  .  busPIRone (BUSPAR) 30 MG tablet, Take 1 tablet (30 mg total) by mouth 2 (two) times daily., Disp: 60 tablet, Rfl: 1 .  desvenlafaxine (PRISTIQ) 50 MG 24 hr tablet, Take 3 tablets (150 mg total) by mouth daily., Disp: 90 tablet, Rfl: 1 .  methylPREDNISolone (MEDROL DOSEPAK) 4 MG TBPK tablet, Take as directed (Patient not taking: Reported on 08/22/2019), Disp: 21 tablet, Rfl: 0 Medication Side Effects: none  Family  Medical/ Social History: Changes? No  MENTAL HEALTH EXAM:  There were no vitals taken for this visit.There is no height or weight on file to calculate BMI.  General Appearance: Casual, Neat and Well Groomed  Eye Contact:  Good  Speech:  Clear and Coherent and Normal Rate  Volume:  Normal  Mood:  Euthymic  Affect:  Appropriate  Thought Process:  Goal Directed and Descriptions of Associations: Intact  Orientation:  Full (Time, Place, and Person)  Thought Content: Logical    Suicidal Thoughts:  No  Homicidal Thoughts:  No  Memory:  WNL  Judgement:  Good  Insight:  Good  Psychomotor Activity:  Normal  Concentration:  Concentration: Good  Recall:  Good  Fund of Knowledge: Good  Language: Good  Assets:  Desire for Improvement  ADL's:  Intact  Cognition: WNL  Prognosis:  Good    DIAGNOSES:    ICD-10-CM   1. Major depressive disorder, recurrent episode, moderate (HCC)  F33.1   2. Generalized anxiety disorder  F41.1   3. PTSD (post-traumatic stress disorder)  F43.10     Receiving Psychotherapy: Yes  Jennye Moccasin    RECOMMENDATIONS:  PDMP was reviewed. I spent 20 minutes with her. Recommend increasing the BuSpar as well as Pristiq.  She agrees. Continue Xanax 0.5 mg, 1 p.o. 3 times daily as needed. Increase BuSpar to 30 mg p.o. twice daily. Increase Pristiq to 50 mg, 3 p.o. daily. Continue hydroxyzine 50 mg, 1 p.o. 3 times daily. Continue prazosin 2 mg, 1 nightly. Continue therapy. Return in 6 weeks.  Donnal Moat, PA-C

## 2019-08-25 ENCOUNTER — Encounter: Payer: Self-pay | Admitting: Physician Assistant

## 2019-08-29 ENCOUNTER — Ambulatory Visit (INDEPENDENT_AMBULATORY_CARE_PROVIDER_SITE_OTHER): Payer: 59 | Admitting: Podiatry

## 2019-08-29 ENCOUNTER — Other Ambulatory Visit: Payer: Self-pay

## 2019-08-29 DIAGNOSIS — M7662 Achilles tendinitis, left leg: Secondary | ICD-10-CM

## 2019-08-29 DIAGNOSIS — M79672 Pain in left foot: Secondary | ICD-10-CM

## 2019-08-29 NOTE — Patient Instructions (Signed)

## 2019-09-03 ENCOUNTER — Telehealth: Payer: Self-pay

## 2019-09-03 NOTE — Telephone Encounter (Signed)
Prior authorization submitted and approved for DESVENLAFAXINE SUCCNT ER 50 MG effective 08/28/2019-08/27/2020 With [employer] p [phone], f [phone]   EA#[id number] Bin [id number] grp [facility] pcn BHIFP

## 2019-09-06 NOTE — Progress Notes (Signed)
Subjective: 54 year old female presents the office today for follow-up evaluation of left heel pain Achilles tendinitis.  She was last seen by Dr. Marylene Land and she states that she is doing much better.  She was using the Medrol Dosepak and she was in the cam boot.  She states at max her pain level is 2/10 and she is doing much better.  Some occasional discomfort and minimal swelling but overall improved as well. Denies any systemic complaints such as fevers, chills, nausea, vomiting. No acute changes since last appointment, and no other complaints at this time.   Objective: AAO x3, NAD DP/PT pulses palpable bilaterally, CRT less than 3 seconds There is minimal discomfort of the posterior aspect of the left heel along the Achilles tendon.  Thompson test negative and I am able to palpate the borders of the Achilles tendon.  There is no edema, erythema today.  No pain with lateral compression of calcaneus no other areas of discomfort. No pain with calf compression, swelling, warmth, erythema  Assessment: Achilles tendinitis left side with improvement  Plan: -All treatment options discussed with the patient including all alternatives, risks, complications.  -She is doing much better.  I dispensed a heel lift that she can wear inside of her shoe as needed, her to continue stretching, icing exercises daily.  Discussed stretching, rehab exercises as well.  As she is doing much better we will hold off on formal physical therapy but if symptoms continue consider doing this for MRI. -Patient encouraged to call the office with any questions, concerns, change in symptoms.   Vivi Barrack DPM

## 2019-10-03 ENCOUNTER — Ambulatory Visit: Payer: 59 | Admitting: Physician Assistant

## 2019-10-19 ENCOUNTER — Other Ambulatory Visit: Payer: Self-pay | Admitting: Physician Assistant

## 2019-12-14 ENCOUNTER — Encounter: Payer: Self-pay | Admitting: Physician Assistant

## 2019-12-14 ENCOUNTER — Telehealth: Payer: Self-pay | Admitting: Physician Assistant

## 2019-12-14 ENCOUNTER — Telehealth (INDEPENDENT_AMBULATORY_CARE_PROVIDER_SITE_OTHER): Payer: 59 | Admitting: Physician Assistant

## 2019-12-14 DIAGNOSIS — F4310 Post-traumatic stress disorder, unspecified: Secondary | ICD-10-CM | POA: Diagnosis not present

## 2019-12-14 DIAGNOSIS — F411 Generalized anxiety disorder: Secondary | ICD-10-CM | POA: Diagnosis not present

## 2019-12-14 DIAGNOSIS — F331 Major depressive disorder, recurrent, moderate: Secondary | ICD-10-CM | POA: Diagnosis not present

## 2019-12-14 MED ORDER — DESVENLAFAXINE SUCCINATE ER 100 MG PO TB24: 200.0000 mg | ORAL_TABLET | Freq: Every day | ORAL | 0 refills | Status: DC

## 2019-12-14 NOTE — Progress Notes (Signed)
[facility]  Patient ID: Mary Barrera,  MRN: [id number]  PCP: Mila Palmer, MD  Date of Evaluation: 12/14/2019 Time spent:20 minutes  Chief Complaint:  Chief Complaint    Anxiety; Depression     Virtual Visit via Telehealth  I connected with patient by telephone, with their informed consent, and verified patient privacy and that I am speaking with the correct person using two identifiers.  I am private, in my office and the patient is at home.  I discussed the limitations, risks, security and privacy concerns of performing an evaluation and management service by telephone and the availability of in person appointments. I also discussed with the patient that there may be a patient responsible charge related to this service. The patient expressed understanding and agreed to proceed.   I discussed the assessment and treatment plan with the patient. The patient was provided an opportunity to ask questions and all were answered. The patient agreed with the plan and demonstrated an understanding of the instructions.   The patient was advised to call back or seek an in-person evaluation if the symptoms worsen or if the condition fails to improve as anticipated.  I provided 20  minutes of non-face-to-face time during this encounter.  HISTORY/CURRENT STATUS: HPI For routine med check.   Since she had covid last year in October, states she doesn't have any energy and motivation are both very poor. Also has trouble focusing and getting things done in a timely manner. Not as detail oriented like she used to be.  Has looked up sx online and thinks she needs Adderall. Works 3 days per week and has missed approx 3 days per month the past few months, b/c more anxious and can't go. "i'm always disappointed. I feel depressed." Everything is going wrong in her life. Not enjoying things. Very sensitive and cries easily.No night terrors. Denies SI/HI.  Patient denies increased energy with  decreased need for sleep, no increased talkativeness, no racing thoughts, no impulsivity or risky behaviors, no increased spending, no increased libido, no grandiosity, no increased irritability or anger, no paranoia, and no hallucinations.  Denies dizziness, syncope, seizures, numbness, tingling, tremor, tics, unsteady gait, slurred speech, confusion. Denies muscle or joint pain, stiffness, or dystonia.  Individual Medical History/ Review of Systems: Changes? :No    Past medications for mental health diagnoses include: Gabapentin for back pain caused tremor, Klonopin helped but caused memory issues, Celexa,and 1 other that she cannot remember. States Pristiq has been the best thing that she has taken.  Allergies: Humalog [insulin lispro], Atorvastatin, Hydrocodone-acetaminophen, Oxycodone, Simvastatin, and Tramadol  Current Medications:  Current Outpatient Medications:    ALPRAZolam (XANAX) 0.5 MG tablet, Take 1 tablet (0.5 mg total) by mouth 3 (three) times daily as needed for anxiety., Disp: 90 tablet, Rfl: 1   aspirin EC 81 MG tablet, Take 81 mg by mouth daily., Disp: , Rfl:    busPIRone (BUSPAR) 30 MG tablet, TAKE 1 TABLET BY MOUTH TWICE A DAY, Disp: 60 tablet, Rfl: 1   celecoxib (CELEBREX) 200 MG capsule, Take 200 mg by mouth daily., Disp: , Rfl: 5   fluticasone (FLONASE) 50 MCG/ACT nasal spray, Place 2 sprays into both nostrils 2 (two) times daily as needed for allergies., Disp: , Rfl: 3   Insulin Glargine (BASAGLAR KWIKPEN) 100 UNIT/ML SOPN, INJ 30 UNITS [place] QHS UTD, Disp: , Rfl:    NOREL AD 4-10-325 MG TABS, Take 1 tablet by mouth every 8 (eight) hours as needed., Disp: ,  Rfl:    OZEMPIC, 0.25 OR 0.5 MG/DOSE, 2 MG/1.5ML SOPN, , Disp: , Rfl:    prazosin (MINIPRESS) 2 MG capsule, Take 1 capsule (2 mg total) by mouth at bedtime., Disp: 30 capsule, Rfl: 5   ramipril (ALTACE) 5 MG capsule, Take 5 mg by mouth at bedtime., Disp: , Rfl: 4   SYMBICORT 160-4.5 MCG/ACT inhaler,  SMARTSIG:2 Puff(s) By Mouth Twice Daily, Disp: , Rfl:    VITAMIN D, CHOLECALCIFEROL, PO, Take 2 tablets by mouth daily., Disp: , Rfl:    desvenlafaxine (PRISTIQ) 100 MG 24 hr tablet, Take 2 tablets (200 mg total) by mouth daily., Disp: 60 tablet, Rfl: 0   rosuvastatin (CRESTOR) 10 MG tablet, , Disp: , Rfl:  Medication Side Effects: none  Family Medical/ Social History: Changes? No  MENTAL HEALTH EXAM:  There were no vitals taken for this visit.There is no height or weight on file to calculate BMI.  General Appearance: unable to assess  Eye Contact:  unable to assess  Speech:  Clear and Coherent and Normal Rate  Volume:  Normal  Mood:  Euthymic  Affect:  unable to assess  Thought Process:  Goal Directed and Descriptions of Associations: Intact  Orientation:  Full (Time, Place, and Person)  Thought Content: Logical   Suicidal Thoughts:  No  Homicidal Thoughts:  No  Memory:  WNL  Judgement:  Good  Insight:  Good  Psychomotor Activity:  unable to assess  Concentration:  Concentration: Fair and Attention Span: Fair  Recall:  Good  Fund of Knowledge: Good  Language: Good  Assets:  Desire for Improvement  ADL's:  Intact  Cognition: WNL  Prognosis:  Good    DIAGNOSES:    ICD-10-CM   1. Major depressive disorder, recurrent episode, moderate (HCC)  F33.1   2. Generalized anxiety disorder  F41.1   3. PTSD (post-traumatic stress disorder)  F43.10     Receiving Psychotherapy: Yes  [name]    RECOMMENDATIONS:  PDMP was reviewed. I provided 20 mins non face to face time during this encounter. Depression could be causing the sx of decreased focus/concentration. Need to tx that first and then disc possible ADD meds. First wiould think of Strattera, Guanficine, or Clonidine, which we disc. Unable to give stimulant b/c on BZ.  Doesn't take it often though, so stimulant may be an alternative.She'll need to be seen at next OV and BP checked before considering a stimulant. Consider  Adderall XR or another long acting med. Recommend increasing the BuSpar as well as Pristiq.  She agrees. Continue Xanax 0.5 mg, 1 p.o. 3 times daily as needed. Cont BuSpar to 30 mg p.o. twice daily. Increase Pristiq to 100 mg, 2 po qd. Continue prazosin 2 mg, 1 nightly. Continue therapy. Return in 4 weeks.  Melony Overly, PA-C

## 2019-12-14 NOTE — Telephone Encounter (Signed)
Mary Barrera, Mary Barrera are scheduled for a virtual visit with your provider today.    Just as we do with appointments in the office, we must obtain your consent to participate.  Your consent will be active for this visit and any virtual visit you may have with one of our providers in the next 365 days.    If you have a MyChart account, I can also send a copy of this consent to you electronically.  All virtual visits are billed to your insurance company just like a traditional visit in the office.  As this is a virtual visit, video technology does not allow for your provider to perform a traditional examination.  This may limit your provider's ability to fully assess your condition.  If your provider identifies any concerns that need to be evaluated in person or the need to arrange testing such as labs, EKG, etc, we will make arrangements to do so.    Although advances in technology are sophisticated, we cannot ensure that it will always work on either your end or our end.  If the connection with a video visit is poor, we may have to switch to a telephone visit.  With either a video or telephone visit, we are not always able to ensure that we have a secure connection.   I need to obtain your verbal consent now.   Are you willing to proceed with your visit today?   Mary Barrera has provided verbal consent on 12/14/2019 for a virtual visit (video or telephone).   Melony Overly, PA-C 12/14/2019  7:26 PM

## 2019-12-28 ENCOUNTER — Other Ambulatory Visit: Payer: Self-pay | Admitting: Physician Assistant

## 2019-12-28 NOTE — Telephone Encounter (Signed)
Please review

## 2020-01-10 ENCOUNTER — Ambulatory Visit: Payer: 59 | Admitting: Physician Assistant

## 2020-01-22 ENCOUNTER — Ambulatory Visit: Payer: 59 | Admitting: Physician Assistant

## 2020-01-30 ENCOUNTER — Other Ambulatory Visit: Payer: Self-pay | Admitting: Physician Assistant

## 2020-02-08 ENCOUNTER — Ambulatory Visit: Payer: 59 | Admitting: Physician Assistant

## 2020-02-13 ENCOUNTER — Ambulatory Visit: Payer: 59 | Admitting: Physician Assistant

## 2020-02-20 ENCOUNTER — Ambulatory Visit: Payer: 59 | Admitting: Physician Assistant

## 2020-02-20 ENCOUNTER — Telehealth: Payer: Self-pay

## 2020-02-20 NOTE — Telephone Encounter (Signed)
Prior authorization submitted and approved for DESVENLAFAXINE SUCCNT ER 100 MG effective 12/29/2019-12/28/2020 with [employer] ID# [id number] P. [phone] F. [phone]

## 2020-02-26 ENCOUNTER — Other Ambulatory Visit: Payer: Self-pay | Admitting: Physician Assistant

## 2020-02-27 ENCOUNTER — Ambulatory Visit: Payer: 59 | Admitting: Physician Assistant

## 2020-02-28 ENCOUNTER — Other Ambulatory Visit: Payer: Self-pay | Admitting: Physician Assistant

## 2020-02-28 ENCOUNTER — Telehealth: Payer: Self-pay | Admitting: Physician Assistant

## 2020-03-04 NOTE — Telephone Encounter (Signed)
Error

## 2020-03-05 ENCOUNTER — Encounter: Payer: Self-pay | Admitting: Physician Assistant

## 2020-03-05 ENCOUNTER — Other Ambulatory Visit: Payer: Self-pay

## 2020-03-05 ENCOUNTER — Ambulatory Visit (INDEPENDENT_AMBULATORY_CARE_PROVIDER_SITE_OTHER): Payer: 59 | Admitting: Physician Assistant

## 2020-03-05 VITALS — BP 161/76 | HR 88

## 2020-03-05 DIAGNOSIS — R5383 Other fatigue: Secondary | ICD-10-CM

## 2020-03-05 DIAGNOSIS — F4310 Post-traumatic stress disorder, unspecified: Secondary | ICD-10-CM | POA: Diagnosis not present

## 2020-03-05 DIAGNOSIS — F411 Generalized anxiety disorder: Secondary | ICD-10-CM | POA: Diagnosis not present

## 2020-03-05 DIAGNOSIS — F331 Major depressive disorder, recurrent, moderate: Secondary | ICD-10-CM | POA: Insufficient documentation

## 2020-03-05 DIAGNOSIS — R030 Elevated blood-pressure reading, without diagnosis of hypertension: Secondary | ICD-10-CM

## 2020-03-05 DIAGNOSIS — F515 Nightmare disorder: Secondary | ICD-10-CM | POA: Insufficient documentation

## 2020-03-05 MED ORDER — MODAFINIL 200 MG PO TABS: 100.0000 mg | ORAL_TABLET | Freq: Every morning | ORAL | 0 refills | Status: DC

## 2020-03-05 MED ORDER — BUSPIRONE HCL 30 MG PO TABS: 30.0000 mg | ORAL_TABLET | Freq: Two times a day (BID) | ORAL | 1 refills | Status: DC

## 2020-03-05 MED ORDER — DESVENLAFAXINE SUCCINATE ER 100 MG PO TB24: 200.0000 mg | ORAL_TABLET | Freq: Every day | ORAL | 1 refills | Status: DC

## 2020-03-05 NOTE — Progress Notes (Signed)
[facility]  Patient ID: Mary Barrera,  MRN: [id number]  PCP: Mila Palmer, MD  Date of Evaluation: 03/05/2020 Time spent:40 minutes  Chief Complaint:  Chief Complaint    Anxiety; Depression; Insomnia      HISTORY/CURRENT STATUS: For routine med check.   Couldn't tolerate the Pristiq 200 mg dose. It made her more tired. About 2 months ago, she went down to 150mg .  Has been cutting the pills in half and it hasn't bothered her at all.  Biggest problem she has is not being able to focus. Hard time finishing things like she used to. Isn't motivated either.  One of her god-children told her about adderall and said it's good. Ever since she had covid, has had 'foggy brain.'  Is able to enjoy things. Energy is good after she gets going about something.  Not isolating.  Has a new man in her life and she's happy with him. Work is very stressful and is looking for another job. Not crying easily. Denies SI/HI.  Anxiety isn't much of a problem now. Doesn't take Xanax often.  She is no longer having nightmares.  The prazosin helps a lot.  She sleeps fairly well.  There are times when she wakes up frequently and there is no rhyme or reason to it.  Patient denies increased energy with decreased need for sleep, no increased talkativeness, no racing thoughts, no impulsivity or risky behaviors, no increased spending, no increased libido, no grandiosity, no increased irritability or anger, no paranoia, and no hallucinations.  Denies dizziness, syncope, seizures, numbness, tingling, tremor, tics, unsteady gait, slurred speech, confusion. Denies muscle or joint pain, stiffness, or dystonia.  Individual Medical History/ Review of Systems: Changes? :No    Past medications for mental health diagnoses include: Gabapentin for back pain caused tremor, Klonopin helped but caused memory issues, Celexa,and 1 other that she cannot remember. States Pristiq has been the best thing that she has  taken.  Allergies: Humalog [insulin lispro], Atorvastatin, Hydrocodone-acetaminophen, Oxycodone, Simvastatin, and Tramadol  Current Medications:  Current Outpatient Medications:  .  ALPRAZolam (XANAX) 0.5 MG tablet, Take 1 tablet (0.5 mg total) by mouth 3 (three) times daily as needed for anxiety., Disp: 90 tablet, Rfl: 1 .  aspirin EC 81 MG tablet, Take 81 mg by mouth daily., Disp: , Rfl:  .  busPIRone (BUSPAR) 30 MG tablet, Take 1 tablet (30 mg total) by mouth 2 (two) times daily., Disp: 180 tablet, Rfl: 1 .  celecoxib (CELEBREX) 200 MG capsule, Take 200 mg by mouth daily., Disp: , Rfl: 5 .  desvenlafaxine (PRISTIQ) 100 MG 24 hr tablet, Take 2 tablets (200 mg total) by mouth daily., Disp: 180 tablet, Rfl: 1 .  fluticasone (FLONASE) 50 MCG/ACT nasal spray, Place 2 sprays into both nostrils 2 (two) times daily as needed for allergies., Disp: , Rfl: 3 .  Insulin Glargine (BASAGLAR KWIKPEN) 100 UNIT/ML SOPN, INJ 30 UNITS [place] QHS UTD, Disp: , Rfl:  .  NOREL AD 4-10-325 MG TABS, Take 1 tablet by mouth every 8 (eight) hours as needed., Disp: , Rfl:  .  OZEMPIC, 0.25 OR 0.5 MG/DOSE, 2 MG/1.5ML SOPN, , Disp: , Rfl:  .  prazosin (MINIPRESS) 2 MG capsule, TAKE 1 CAPSULE BY MOUTH AT BEDTIME, Disp: 90 capsule, Rfl: 0 .  ramipril (ALTACE) 5 MG capsule, Take 5 mg by mouth at bedtime., Disp: , Rfl: 4 .  SYMBICORT 160-4.5 MCG/ACT inhaler, SMARTSIG:2 Puff(s) By Mouth Twice Daily, Disp: , Rfl:  .  VITAMIN D, CHOLECALCIFEROL, PO, Take 2 tablets by mouth daily., Disp: , Rfl:  .  modafinil (PROVIGIL) 200 MG tablet, Take 0.5 tablets (100 mg total) by mouth in the morning., Disp: 30 tablet, Rfl: 0 .  rosuvastatin (CRESTOR) 10 MG tablet, , Disp: , Rfl:  Medication Side Effects: none  Family Medical/ Social History: Changes? No  MENTAL HEALTH EXAM:  Blood pressure (!) 161/76, pulse 88.There is no height or weight on file to calculate BMI.  General Appearance: Casual, Well Groomed and Obese  Eye Contact:  Good   Speech:  Clear and Coherent and Normal Rate  Volume:  Normal  Mood:  Euthymic  Affect:  Appropriate  Thought Process:  Goal Directed and Descriptions of Associations: Intact  Orientation:  Full (Time, Place, and Person)  Thought Content: Logical   Suicidal Thoughts:  No  Homicidal Thoughts:  No  Memory:  WNL  Judgement:  Good  Insight:  Good  Psychomotor Activity:  Normal  Concentration:  Concentration: Fair and Attention Span: Fair  Recall:  Good  Fund of Knowledge: Good  Language: Good  Assets:  Desire for Improvement  ADL's:  Intact  Cognition: WNL  Prognosis:  Good    DIAGNOSES:    ICD-10-CM   1. Major depressive disorder, recurrent episode, moderate (HCC)  F33.1   2. Fatigue, unspecified type  R53.83   3. Generalized anxiety disorder  F41.1   4. PTSD (post-traumatic stress disorder)  F43.10   5. Elevated blood pressure reading  R03.0   6. Nightmares  F51.5     Receiving Psychotherapy: Yes  [name]     RECOMMENDATIONS:  PDMP reviewed. I provided 40 mins non face to face time during this encounter. Discussed the elevated BP. PT states it's never been that high before.  She does take the ramipril and the prazosin.  It was checked twice and reading was almost exactly the same in other arm, large cuff both times. Advised her to check BP at pharmacy and keep a log. If it's staying 140/90 or above, make appt w/ PCP to eval.  We discussed the lack of concentration.  I do not recommend giving her a strong stimulant, we can treat the inability to focus with Strattera, or modafinil.  Even though it is a stimulant is not as strong as Adderall or some of the others.  I do not want to treat with clonidine or guanfacine because she is already on prazosin for nightmares.  She prefers to try modafinil because it works quickly.  She understands that prescription is much cheaper at [facility] or [facility], out-of-pocket because insurances will not pay for this drug because it is only  FDA approved for narcolepsy.  I am comfortable going ahead and prescribe in the modafinil even though her blood pressure is elevated.  Again she knows to follow-up with her PCP if it stays elevated. Start modafinil 200 mg, one half p.o. every morning. Continue Xanax 0.5 mg, 1 p.o. 3 times daily as needed. She takes rarely.  Cont BuSpar to 30 mg p.o. twice daily. Continue  Pristiq 100 mg, 1.5 pills qd. (Rx written for 2) She's aware that the pill isn't supposed to be split, but since she's doing well with it, I'm not going to change anything.  Continue prazosin 2 mg, 1 nightly. Continue therapy. Return in 4-6 weeks.  Melony Overly, PA-C

## 2020-04-23 ENCOUNTER — Encounter: Payer: Self-pay | Admitting: Physician Assistant

## 2020-04-23 ENCOUNTER — Ambulatory Visit (INDEPENDENT_AMBULATORY_CARE_PROVIDER_SITE_OTHER): Payer: 59 | Admitting: Physician Assistant

## 2020-04-23 VITALS — BP 150/94 | HR 84

## 2020-04-23 DIAGNOSIS — F515 Nightmare disorder: Secondary | ICD-10-CM

## 2020-04-23 DIAGNOSIS — F411 Generalized anxiety disorder: Secondary | ICD-10-CM | POA: Diagnosis not present

## 2020-04-23 DIAGNOSIS — R030 Elevated blood-pressure reading, without diagnosis of hypertension: Secondary | ICD-10-CM

## 2020-04-23 DIAGNOSIS — F4310 Post-traumatic stress disorder, unspecified: Secondary | ICD-10-CM

## 2020-04-23 DIAGNOSIS — F3341 Major depressive disorder, recurrent, in partial remission: Secondary | ICD-10-CM

## 2020-04-23 MED ORDER — MODAFINIL 200 MG PO TABS: 100.0000 mg | ORAL_TABLET | Freq: Every morning | ORAL | 3 refills | Status: DC

## 2020-04-23 NOTE — Progress Notes (Signed)
[facility]  Patient ID: Mary Barrera,  MRN: [id number]  PCP: Mila Palmer, MD  Date of Evaluation: 04/23/2020 Time spent:30 minutes  Chief Complaint:  Chief Complaint    Depression; Anxiety      HISTORY/CURRENT STATUS: For routine med check.   Is doing really well! The Modafanil has helped her a lot. More energy and able to focus more.  She has a new job, been there for about a month and enjoys it so far. Is able to enjoy things. Energy and motivation are better.  Not isolating. Not crying easily. Denies SI/HI.  Anxiety isn't much of a problem now. Doesn't take Xanax often.  She is no longer having nightmares.  The prazosin helps a lot.  She sleeps fairly well.  There are times when she wakes up frequently and there is no rhyme or reason to it.  Patient denies increased energy with decreased need for sleep, no increased talkativeness, no racing thoughts, no impulsivity or risky behaviors, no increased spending, no increased libido, no grandiosity, no increased irritability or anger, no paranoia, and no hallucinations.  Denies dizziness, syncope, seizures, numbness, tingling, tremor, tics, unsteady gait, slurred speech, confusion. Denies muscle or joint pain, stiffness, or dystonia.  Individual Medical History/ Review of Systems: Changes? :No    Past medications for mental health diagnoses include: Gabapentin for back pain caused tremor, Klonopin helped but caused memory issues, Celexa,and 1 other that she cannot remember. States Pristiq has been the best thing that she has taken.  Allergies: Humalog [insulin lispro], Atorvastatin, Hydrocodone-acetaminophen, Oxycodone, Simvastatin, and Tramadol  Current Medications:  Current Outpatient Medications:  .  aspirin EC 81 MG tablet, Take 81 mg by mouth daily., Disp: , Rfl:  .  busPIRone (BUSPAR) 30 MG tablet, Take 1 tablet (30 mg total) by mouth 2 (two) times daily., Disp: 180 tablet, Rfl: 1 .  celecoxib (CELEBREX)  200 MG capsule, Take 200 mg by mouth daily., Disp: , Rfl: 5 .  desvenlafaxine (PRISTIQ) 100 MG 24 hr tablet, Take 2 tablets (200 mg total) by mouth daily., Disp: 180 tablet, Rfl: 1 .  diphenhydrAMINE (BENADRYL ALLERGY) 25 mg capsule, Take 25 mg by mouth every 8 (eight) hours as needed for sleep., Disp: , Rfl:  .  fluticasone (FLONASE) 50 MCG/ACT nasal spray, Place 2 sprays into both nostrils 2 (two) times daily as needed for allergies., Disp: , Rfl: 3 .  Insulin Glargine (BASAGLAR KWIKPEN) 100 UNIT/ML SOPN, INJ 30 UNITS [place] QHS UTD, Disp: , Rfl:  .  prazosin (MINIPRESS) 2 MG capsule, TAKE 1 CAPSULE BY MOUTH AT BEDTIME, Disp: 90 capsule, Rfl: 0 .  ramipril (ALTACE) 5 MG capsule, Take 5 mg by mouth at bedtime., Disp: , Rfl: 4 .  VITAMIN D, CHOLECALCIFEROL, PO, Take 2 tablets by mouth daily., Disp: , Rfl:  .  ALPRAZolam (XANAX) 0.5 MG tablet, Take 1 tablet (0.5 mg total) by mouth 3 (three) times daily as needed for anxiety. (Patient not taking: Reported on 04/23/2020), Disp: 90 tablet, Rfl: 1 .  modafinil (PROVIGIL) 200 MG tablet, Take 0.5 tablets (100 mg total) by mouth in the morning., Disp: 30 tablet, Rfl: 3 .  NOREL AD 4-10-325 MG TABS, Take 1 tablet by mouth every 8 (eight) hours as needed. (Patient not taking: Reported on 04/23/2020), Disp: , Rfl:  .  OZEMPIC, 0.25 OR 0.5 MG/DOSE, 2 MG/1.5ML SOPN, , Disp: , Rfl:  .  rosuvastatin (CRESTOR) 10 MG tablet, , Disp: , Rfl:  .  SYMBICORT 160-4.5  MCG/ACT inhaler, SMARTSIG:2 Puff(s) By Mouth Twice Daily (Patient not taking: Reported on 04/23/2020), Disp: , Rfl:  Medication Side Effects: none  Family Medical/ Social History: Changes? No  MENTAL HEALTH EXAM:  Blood pressure (!) 150/94, pulse 84.There is no height or weight on file to calculate BMI.  General Appearance: Casual, Well Groomed and Obese  Eye Contact:  Good  Speech:  Clear and Coherent and Normal Rate  Volume:  Normal  Mood:  Euthymic  Affect:  Appropriate  Thought Process:  Goal Directed  and Descriptions of Associations: Intact  Orientation:  Full (Time, Place, and Person)  Thought Content: Logical   Suicidal Thoughts:  No  Homicidal Thoughts:  No  Memory:  WNL  Judgement:  Good  Insight:  Good  Psychomotor Activity:  Normal  Concentration:  Concentration: Fair and Attention Span: Fair  Recall:  Good  Fund of Knowledge: Good  Language: Good  Assets:  Desire for Improvement  ADL's:  Intact  Cognition: WNL  Prognosis:  Good    DIAGNOSES:    ICD-10-CM   1. Recurrent major depressive disorder, in partial remission (HCC)  F33.41   2. Generalized anxiety disorder  F41.1   3. PTSD (post-traumatic stress disorder)  F43.10   4. Nightmares  F51.5   5. Elevated blood pressure reading  R03.0     Receiving Psychotherapy: Yes  [name]     RECOMMENDATIONS:  PDMP reviewed. I provided 30 mins non face to face time during this encounter again discussing her blood pressure.  She does check it at home and it is never as high as it has been here.  She had to rush to get here, [place] that [place] her late.  She does check her blood pressure at home and gets readings that are 140s over 80s or less.  She will continue to watch her blood pressures and see her PCP if it is consistently greater than 140/90. Continue modafinil 200 mg, one half p.o. every morning. Continue Xanax 0.5 mg, 1 p.o. 3 times daily as needed. She takes rarely.  Cont BuSpar to 30 mg p.o. twice daily. Continue  Pristiq 100 mg, 1.5 pills qd. (Rx written for 2)  Continue prazosin 2 mg, 1 nightly. Continue therapy. Return in 6 months.  Melony Overly, PA-C

## 2020-05-15 ENCOUNTER — Other Ambulatory Visit: Payer: Self-pay | Admitting: Physician Assistant

## 2020-07-08 ENCOUNTER — Other Ambulatory Visit: Payer: Self-pay | Admitting: Physician Assistant

## 2020-07-11 IMAGING — CT CT ANGIO CHEST
2 of 6 series · 18 of 46 positions shown · IV contrast (omnipaque)
Comparison: None.

CLINICAL DATA: Shortness of breath since [DATE][REDACTED]. [id number]
positive on approximately [REDACTED].

EXAM:
CT ANGIOGRAPHY CHEST WITH CONTRAST
TECHNIQUE: Multidetector CT imaging of the chest was performed using the
standard protocol during bolus administration of intravenous
contrast. Multiplanar CT image reconstructions and MIPs were
obtained to evaluate the vascular anatomy.
CONTRAST:  57mL OMNIPAQUE IOHEXOL 350 MG/ML SOLN

[Series 6: thins · axial · 0.68mm/px · z∈[+1051,+1267]mm · 15 of 238 slices shown]
[im 11/238  lung]
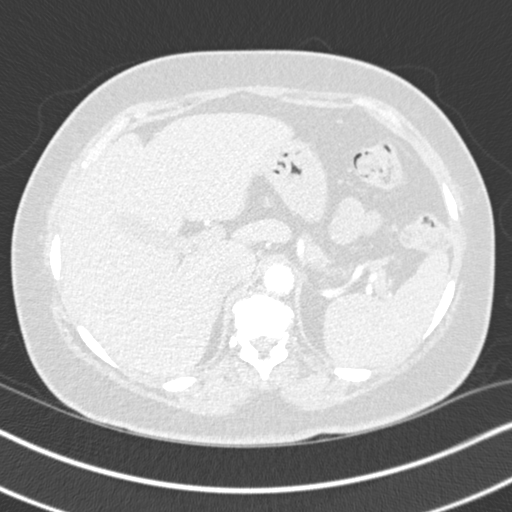
[im 31/238  soft-tissue]
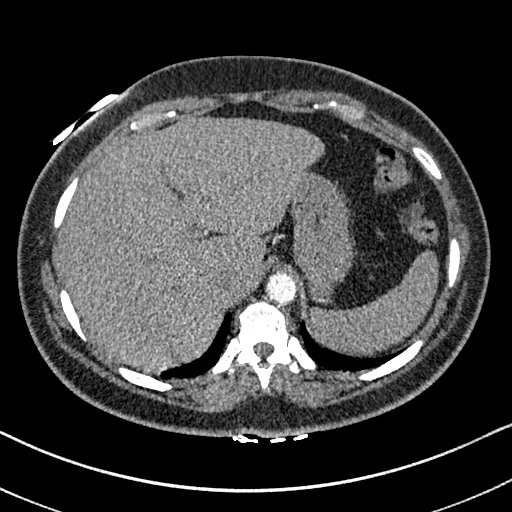
[im 42/238  lung]
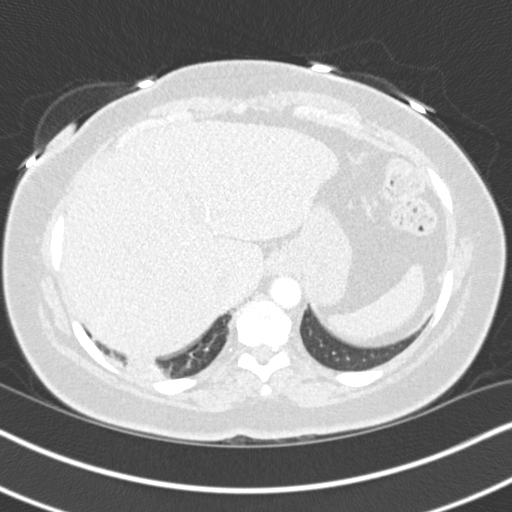
[im 62/238  soft-tissue]
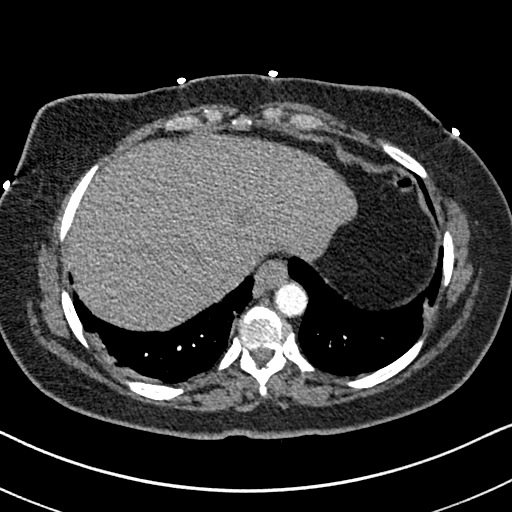
[im 73/238  lung]
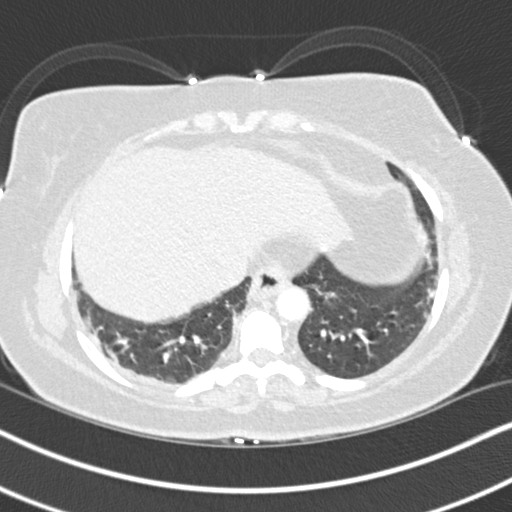
[im 93/238  soft-tissue]
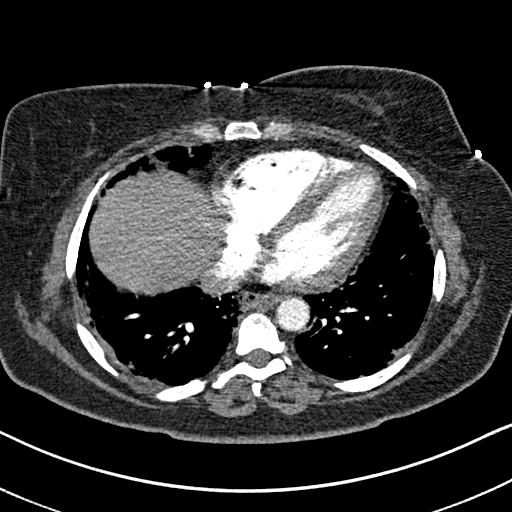
[im 104/238  lung]
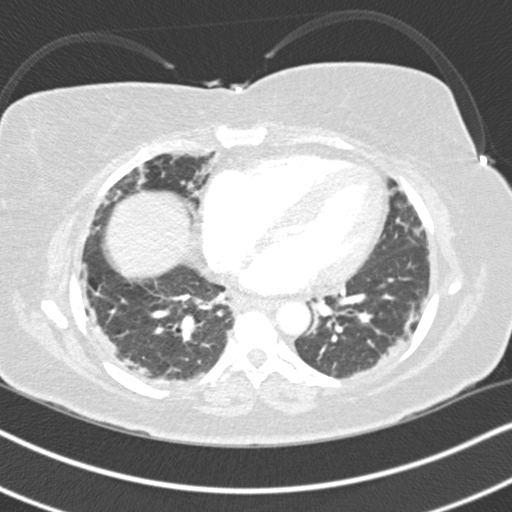
[im 124/238  soft-tissue]
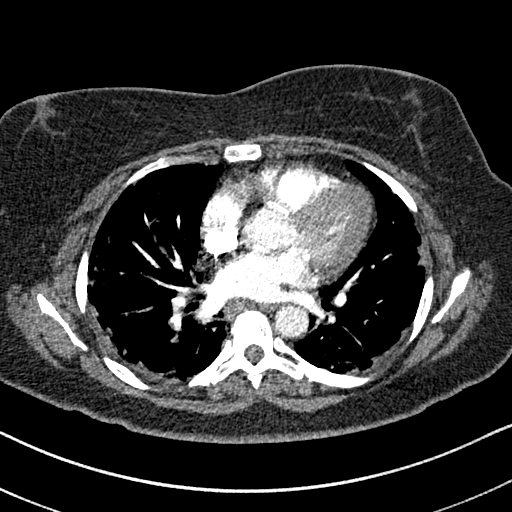
[im 134/238  lung]
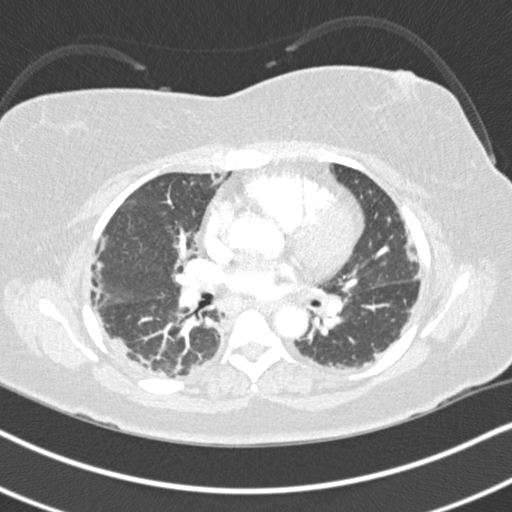
[im 145/238  soft-tissue]
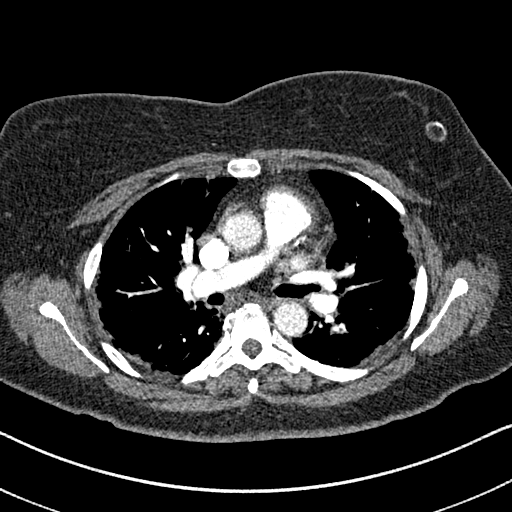
[im 165/238  lung]
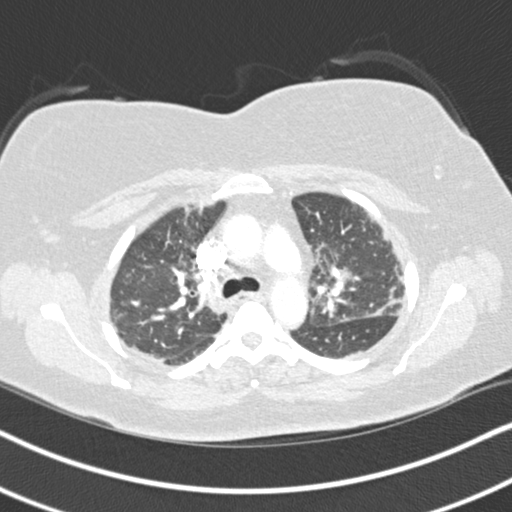
[im 176/238  soft-tissue]
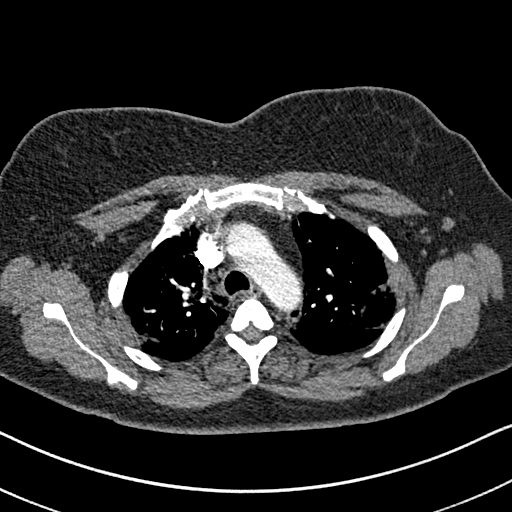
[im 196/238  lung]
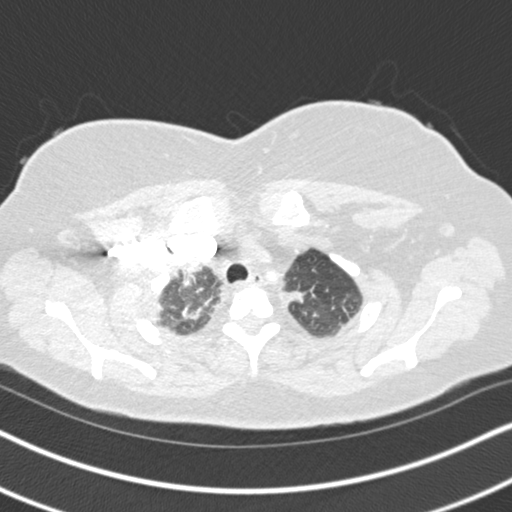
[im 207/238  soft-tissue]
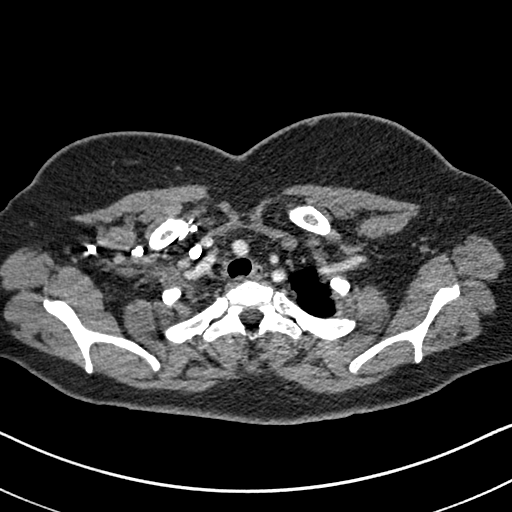
[im 227/238  lung]
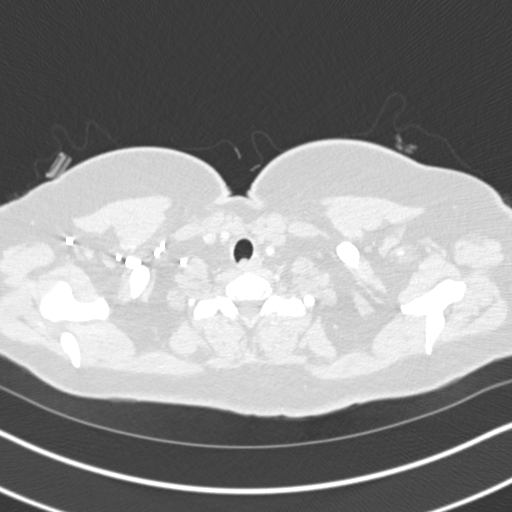

[Series 8: coronal mpr · coronal · 0.48mm/px · 3 of 151 slices shown]
[im 38/151  soft-tissue]
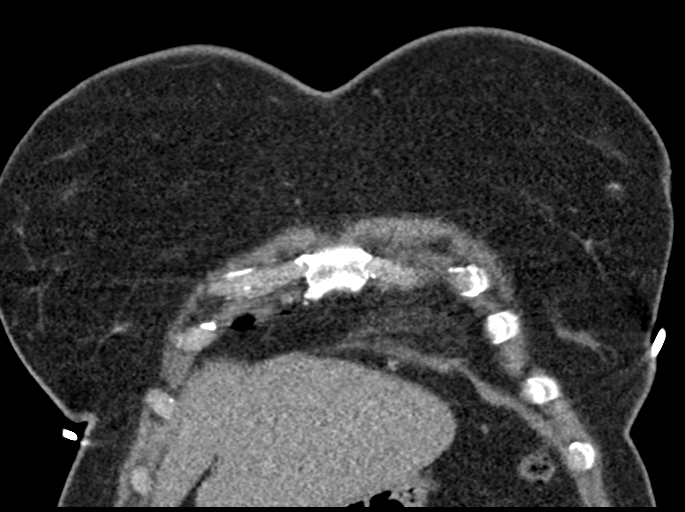
[im 76/151  soft-tissue]
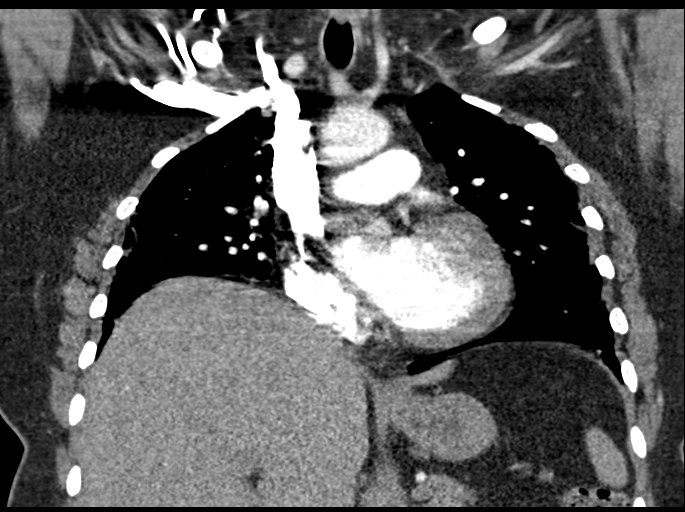
[im 113/151  soft-tissue]
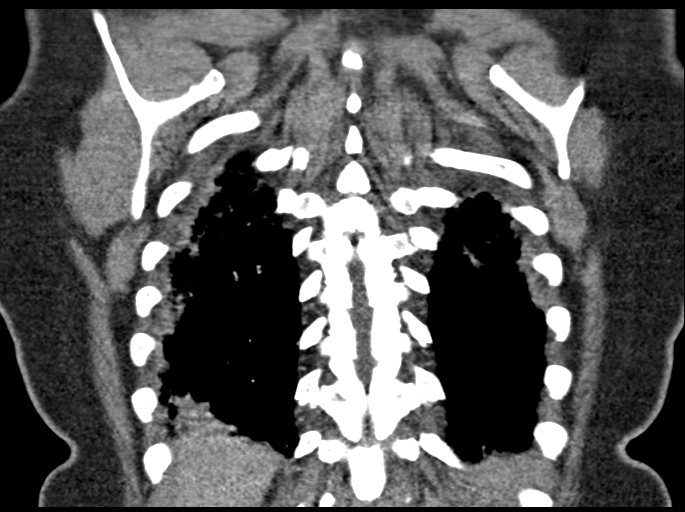

[18 of 46 positions shown; findings below may reference images not displayed]

FINDINGS: Cardiovascular: Some of the most peripheral segmental and
subsegmental pulmonary artery branches are difficult to definitively
characterize due to mild patient breathing motion artifact, however,
there is no pulmonary embolism identified within the main, lobar or
segmental pulmonary arteries bilaterally.

Heart size is normal. No thoracic aortic aneurysm or evidence of
aortic dissection.

Mediastinum/Nodes: No mass or enlarged lymph nodes are seen within
the mediastinum or perihilar regions. Esophagus is unremarkable.
Trachea and central bronchi are unremarkable.

Lungs/Pleura: Patchy peripheral/pleural based consolidations
bilaterally, most suggestive of atelectasis and/or respiratory
bronchiolitis, pneumonia considered less likely. No pleural effusion
or pneumothorax.

Upper Abdomen: Limited images of the upper abdomen are unremarkable.

Musculoskeletal: No acute or suspicious osseous finding.

Review of the MIP images confirms the above findings.
IMPRESSION: 1. No pulmonary embolism identified, with mild study limitations
detailed above.
2. Patchy small peripheral/pleural based consolidations bilaterally,
most suggestive of atelectasis and/or respiratory bronchiolitis,
pneumonia considered less likely.

## 2020-08-14 ENCOUNTER — Other Ambulatory Visit: Payer: Self-pay | Admitting: Physician Assistant

## 2020-09-28 ENCOUNTER — Other Ambulatory Visit: Payer: Self-pay | Admitting: Physician Assistant

## 2020-10-03 ENCOUNTER — Other Ambulatory Visit: Payer: Self-pay | Admitting: Physician Assistant

## 2020-10-21 ENCOUNTER — Ambulatory Visit (INDEPENDENT_AMBULATORY_CARE_PROVIDER_SITE_OTHER): Payer: 59 | Admitting: Physician Assistant

## 2020-10-21 ENCOUNTER — Encounter: Payer: Self-pay | Admitting: Physician Assistant

## 2020-10-21 ENCOUNTER — Other Ambulatory Visit: Payer: Self-pay

## 2020-10-21 DIAGNOSIS — R5383 Other fatigue: Secondary | ICD-10-CM | POA: Diagnosis not present

## 2020-10-21 DIAGNOSIS — F4323 Adjustment disorder with mixed anxiety and depressed mood: Secondary | ICD-10-CM

## 2020-10-21 DIAGNOSIS — F3341 Major depressive disorder, recurrent, in partial remission: Secondary | ICD-10-CM

## 2020-10-21 DIAGNOSIS — F411 Generalized anxiety disorder: Secondary | ICD-10-CM

## 2020-10-21 NOTE — Progress Notes (Signed)
[facility]  Patient ID: Mary Barrera,  MRN: [id number]  PCP: Mila Palmer, MD  Date of Evaluation: 10/21/2020  time spent:20 minutes  Chief Complaint:  Chief Complaint   Follow-up; Depression; Anxiety     HISTORY/CURRENT STATUS: For routine 68-month med check.   Has been down some, she and her boyfriend broke up, lost her job which set her back. 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 Didn't really want to work as a temp, but needs the money. "It's been a rough 6 months."  Doesn't enjoy much of anything, doesn't have any money to do anything. Denies decreased energy or motivation.  Appetite has not changed. Not isolating.  ADLs are normal.  Does not cry easily.  Sleeps well most of the time.  No nightmares.  Denies suicidal or homicidal thoughts.  The modafinil continues to help with energy, focus and concentration.  Still has anxiety but well treated with Xanax.  Not panic attacks most of the time, just a generalized sense of unease.  Patient denies increased energy with decreased need for sleep, no increased talkativeness, no racing thoughts, no impulsivity or risky behaviors, no increased spending, no increased libido, no grandiosity, no increased irritability or anger, no paranoia, and no hallucinations.  Denies dizziness, syncope, seizures, numbness, tingling, tremor, tics, unsteady gait, slurred speech, confusion. Denies muscle or joint pain, stiffness, or dystonia.  Individual Medical History/ Review of Systems: Changes? :No    Past medications for mental health diagnoses include: Gabapentin for back pain caused tremor, Klonopin helped but caused memory issues, Celexa, and 1 other that she cannot remember.  States Pristiq has been the best thing that she has taken.  Allergies: Humalog [insulin lispro], Atorvastatin, Hydrocodone-acetaminophen, Oxycodone, Simvastatin, and Tramadol  Current Medications:  Current Outpatient  Medications:    ALPRAZolam (XANAX) 0.5 MG tablet, Take 1 tablet (0.5 mg total) by mouth 3 (three) times daily as needed for anxiety., Disp: 90 tablet, Rfl: 1   aspirin EC 81 MG tablet, Take 81 mg by mouth daily., Disp: , Rfl:    busPIRone (BUSPAR) 30 MG tablet, TAKE 1 TABLET BY MOUTH 2 TIMES DAILY., Disp: 180 tablet, Rfl: 0   celecoxib (CELEBREX) 200 MG capsule, Take 200 mg by mouth daily., Disp: , Rfl: 5   desvenlafaxine (PRISTIQ) 100 MG 24 hr tablet, TAKE 2 TABLETS BY MOUTH EVERY DAY, Disp: 180 tablet, Rfl: 0   diphenhydrAMINE (BENADRYL) 25 mg capsule, Take 25 mg by mouth every 8 (eight) hours as needed for sleep., Disp: , Rfl:    fluticasone (FLONASE) 50 MCG/ACT nasal spray, Place 2 sprays into both nostrils 2 (two) times daily as needed for allergies., Disp: , Rfl: 3   Insulin Glargine (BASAGLAR KWIKPEN) 100 UNIT/ML SOPN, INJ 30 UNITS [place] QHS UTD, Disp: , Rfl:    modafinil (PROVIGIL) 200 MG tablet, Take 0.5 tablets (100 mg total) by mouth in the morning., Disp: 30 tablet, Rfl: 3   NOREL AD 4-10-325 MG TABS, Take 1 tablet by mouth every 8 (eight) hours as needed., Disp: , Rfl:    OZEMPIC, 0.25 OR 0.5 MG/DOSE, 2 MG/1.5ML SOPN, , Disp: , Rfl:    prazosin (MINIPRESS) 2 MG capsule, TAKE 1 CAPSULE BY MOUTH EVERYDAY AT BEDTIME, Disp: 90 capsule, Rfl: 0   ramipril (ALTACE) 5 MG capsule, Take 5 mg by mouth at bedtime., Disp: , Rfl: 4   rosuvastatin (CRESTOR) 10 MG tablet, , Disp: , Rfl:    SYMBICORT 160-4.5 MCG/ACT inhaler,  SMARTSIG:2 Puff(s) By Mouth Twice Daily, Disp: , Rfl:    VITAMIN D, CHOLECALCIFEROL, PO, Take 2 tablets by mouth daily., Disp: , Rfl:  Medication Side Effects: none  Family Medical/ Social History: Changes? No  MENTAL HEALTH EXAM:  There were no vitals taken for this visit.There is no height or weight on file to calculate BMI.  General Appearance: Casual, Well Groomed and Obese  Eye Contact:  Good  Speech:  Clear and Coherent and Normal Rate  Volume:  Normal  Mood:   Depressed and sad  Affect:  Depressed and Tearful  Thought Process:  Goal Directed and Descriptions of Associations: Intact  Orientation:  Full (Time, Place, and Person)  Thought Content: Logical   Suicidal Thoughts:  No  Homicidal Thoughts:  No  Memory:  WNL  Judgement:  Good  Insight:  Good  Psychomotor Activity:  Normal  Concentration:  Concentration: Good and Attention Span: Fair  Recall:  Good  Fund of Knowledge: Good  Language: Good  Assets:  Desire for Improvement  ADL's:  Intact  Cognition: WNL  Prognosis:  Good    DIAGNOSES:    ICD-10-CM   1. Situational mixed anxiety and depressive disorder  F43.23     2. Generalized anxiety disorder  F41.1     3. Recurrent major depressive disorder, in partial remission (HCC)  F33.41     4. Fatigue, unspecified type  R53.83        Receiving Psychotherapy: Yes  [name]     RECOMMENDATIONS:  PDMP reviewed.  Last modafinil was 05/07/2020.  Last Xanax 08/22/2019. I provided 20 minutes of face to face time during this encounter, including time spent before and after the visit in records review, medical decision making, and charting.  She's doing well on current meds, even though she's having a hard time with circumstantially.  No changes in treatment at this time. Continue modafinil 200 mg, one half p.o. every morning.  Continue Xanax 0.5 mg, 1 p.o. 3 times daily as needed. She takes rarely.  Cont BuSpar to 30 mg p.o. twice daily. Continue  Pristiq 100 mg, 1.5 pills qd. (Rx written for 2)  Continue therapy. Return in 6 months.  Melony Overly, PA-C

## 2021-01-09 ENCOUNTER — Other Ambulatory Visit: Payer: Self-pay | Admitting: Physician Assistant

## 2021-01-26 ENCOUNTER — Other Ambulatory Visit: Payer: Self-pay | Admitting: Physician Assistant

## 2021-01-27 ENCOUNTER — Other Ambulatory Visit: Payer: Self-pay | Admitting: Physician Assistant

## 2021-04-09 ENCOUNTER — Other Ambulatory Visit: Payer: Self-pay | Admitting: Physician Assistant

## 2021-04-21 ENCOUNTER — Other Ambulatory Visit: Payer: Self-pay

## 2021-04-21 ENCOUNTER — Encounter: Payer: Self-pay | Admitting: Physician Assistant

## 2021-04-21 ENCOUNTER — Ambulatory Visit (INDEPENDENT_AMBULATORY_CARE_PROVIDER_SITE_OTHER): Payer: 59 | Admitting: Physician Assistant

## 2021-04-21 DIAGNOSIS — F3341 Major depressive disorder, recurrent, in partial remission: Secondary | ICD-10-CM | POA: Diagnosis not present

## 2021-04-21 DIAGNOSIS — F411 Generalized anxiety disorder: Secondary | ICD-10-CM

## 2021-04-21 DIAGNOSIS — F900 Attention-deficit hyperactivity disorder, predominantly inattentive type: Secondary | ICD-10-CM | POA: Diagnosis not present

## 2021-04-21 MED ORDER — DESVENLAFAXINE SUCCINATE ER 100 MG PO TB24: 200.0000 mg | ORAL_TABLET | Freq: Every day | ORAL | 1 refills | Status: DC

## 2021-04-21 MED ORDER — MODAFINIL 200 MG PO TABS: 100.0000 mg | ORAL_TABLET | Freq: Every morning | ORAL | 3 refills | Status: DC

## 2021-04-21 NOTE — Progress Notes (Signed)
[facility]  Patient ID: Mary Barrera,  MRN: [id number]  PCP: Mila Palmer, MD  Date of Evaluation: 04/21/2021  time spent:20 minutes  Chief Complaint:  Chief Complaint   Anxiety; Depression; Follow-up     HISTORY/CURRENT STATUS: For routine 75-month med check.   Has had a rough patch. Has been out of work from Nov to Jan 9th. 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 Her son is back home which is stressful, she is going to tell him not to come back when he leaves. He plans to leave by March 1st. With new job, she really needs to be on her game and would like to restart the modafinil if possible.  It is very tedious work and she has trouble staying on task.  Patient denies loss of interest in usual activities and is able to enjoy things.  Denies decreased energy or motivation.  Appetite has not changed.  No extreme sadness, tearfulness, or feelings of hopelessness.  Denies suicidal or homicidal thoughts.  Patient denies increased energy with decreased need for sleep, no increased talkativeness, no racing thoughts, no impulsivity or risky behaviors, no increased spending, no increased libido, no grandiosity, no increased irritability or anger, and no hallucinations.  Denies dizziness, syncope, seizures, numbness, tingling, tremor, tics, unsteady gait, slurred speech, confusion. Denies muscle or joint pain, stiffness, or dystonia.  Individual Medical History/ Review of Systems: Changes? :No    Past medications for mental health diagnoses include: Gabapentin for back pain caused tremor, Klonopin helped but caused memory issues, Celexa, and 1 other that she cannot remember.  States Pristiq has been the best thing that she has taken.  Allergies: Humalog [insulin lispro], Atorvastatin, Hydrocodone-acetaminophen, Oxycodone, Simvastatin, and Tramadol  Current Medications:  Current Outpatient Medications:    ALPRAZolam (XANAX) 0.5 MG tablet, Take 1 tablet  (0.5 mg total) by mouth 3 (three) times daily as needed for anxiety., Disp: 90 tablet, Rfl: 1   aspirin EC 81 MG tablet, Take 81 mg by mouth daily., Disp: , Rfl:    busPIRone (BUSPAR) 30 MG tablet, TAKE 1 TABLET BY MOUTH TWICE A DAY, Disp: 180 tablet, Rfl: 0   celecoxib (CELEBREX) 200 MG capsule, Take 200 mg by mouth daily., Disp: , Rfl: 5   diphenhydrAMINE (BENADRYL) 25 mg capsule, Take 25 mg by mouth every 8 (eight) hours as needed for sleep., Disp: , Rfl:    fluticasone (FLONASE) 50 MCG/ACT nasal spray, Place 2 sprays into both nostrils 2 (two) times daily as needed for allergies., Disp: , Rfl: 3   Insulin Glargine (BASAGLAR KWIKPEN) 100 UNIT/ML SOPN, INJ 30 UNITS [place] QHS UTD, Disp: , Rfl:    NOREL AD 4-10-325 MG TABS, Take 1 tablet by mouth every 8 (eight) hours as needed., Disp: , Rfl:    OZEMPIC, 0.25 OR 0.5 MG/DOSE, 2 MG/1.5ML SOPN, , Disp: , Rfl:    ramipril (ALTACE) 5 MG capsule, Take 5 mg by mouth at bedtime., Disp: , Rfl: 4   desvenlafaxine (PRISTIQ) 100 MG 24 hr tablet, Take 2 tablets (200 mg total) by mouth daily., Disp: 180 tablet, Rfl: 1   modafinil (PROVIGIL) 200 MG tablet, Take 0.5 tablets (100 mg total) by mouth in the morning., Disp: 30 tablet, Rfl: 3   rosuvastatin (CRESTOR) 10 MG tablet, , Disp: , Rfl:    SYMBICORT 160-4.5 MCG/ACT inhaler, SMARTSIG:2 Puff(s) By Mouth Twice Daily (Patient not taking: Reported on 04/21/2021), Disp: , Rfl:    VITAMIN D, CHOLECALCIFEROL, PO,  Take 2 tablets by mouth daily. (Patient not taking: Reported on 04/21/2021), Disp: , Rfl:  Medication Side Effects: none  Family Medical/ Social History: Changes? New job. See HPI.  MENTAL HEALTH EXAM:  There were no vitals taken for this visit.There is no height or weight on file to calculate BMI.  General Appearance: Casual, Well Groomed and Obese  Eye Contact:  Good  Speech:  Clear and Coherent and Normal Rate  Volume:  Normal  Mood:  Euthymic  Affect:  Congruent  Thought Process:  Goal Directed and  Descriptions of Associations: Intact  Orientation:  Full (Time, Place, and Person)  Thought Content: Logical   Suicidal Thoughts:  No  Homicidal Thoughts:  No  Memory:  WNL  Judgement:  Good  Insight:  Good  Psychomotor Activity:  Normal  Concentration:  Concentration: Fair and Attention Span: Fair  Recall:  Good  Fund of Knowledge: Good  Language: Good  Assets:  Desire for Improvement  ADL's:  Intact  Cognition: WNL  Prognosis:  Good    DIAGNOSES:    ICD-10-CM   1. Generalized anxiety disorder  F41.1     2. Recurrent major depressive disorder, in partial remission (HCC)  F33.41     3. Attention deficit hyperactivity disorder (ADHD), predominantly inattentive type  F90.0         Receiving Psychotherapy: Yes  [name]     RECOMMENDATIONS:  PDMP reviewed.  Modafnil filled 05/07/2020. I provided 20 minutes of face to face time during this encounter, including time spent before and after the visit in records review, medical decision making, counseling pertinent to today's visit, and charting.  Discussed her meds.  Okay to restart modafinil. Restart modafinil 200 mg, one half p.o. every morning.  Continue Xanax 0.5 mg, 1 p.o. 3 times daily as needed. She takes rarely.  Cont BuSpar to 30 mg p.o. twice daily. Continue  Pristiq 100 mg, 1.5 pills qd. (Rx written for 2)  Continue therapy. Return in 6 months.  Melony Overly, PA-C

## 2021-04-22 ENCOUNTER — Other Ambulatory Visit: Payer: Self-pay | Admitting: Physician Assistant

## 2021-04-22 NOTE — Telephone Encounter (Signed)
PA needed

## 2021-04-23 NOTE — Telephone Encounter (Signed)
Noted thanks °

## 2021-04-28 ENCOUNTER — Other Ambulatory Visit: Payer: Self-pay

## 2021-04-28 MED ORDER — DESVENLAFAXINE SUCCINATE ER 100 MG PO TB24: 200.0000 mg | ORAL_TABLET | Freq: Every day | ORAL | 0 refills | Status: DC

## 2021-04-30 NOTE — Telephone Encounter (Signed)
Pt received a 30 day supply through [facility] until clarification on her prior authorization.

## 2021-07-01 ENCOUNTER — Other Ambulatory Visit: Payer: Self-pay | Admitting: Physician Assistant

## 2021-07-06 ENCOUNTER — Other Ambulatory Visit: Payer: Self-pay | Admitting: Physician Assistant

## 2021-08-31 ENCOUNTER — Other Ambulatory Visit: Payer: Self-pay | Admitting: Physician Assistant

## 2021-10-20 ENCOUNTER — Ambulatory Visit (INDEPENDENT_AMBULATORY_CARE_PROVIDER_SITE_OTHER): Payer: 59 | Admitting: Physician Assistant

## 2021-10-20 ENCOUNTER — Encounter: Payer: Self-pay | Admitting: Physician Assistant

## 2021-10-20 DIAGNOSIS — F3341 Major depressive disorder, recurrent, in partial remission: Secondary | ICD-10-CM

## 2021-10-20 DIAGNOSIS — F411 Generalized anxiety disorder: Secondary | ICD-10-CM | POA: Diagnosis not present

## 2021-10-20 MED ORDER — BUSPIRONE HCL 30 MG PO TABS: 30.0000 mg | ORAL_TABLET | Freq: Two times a day (BID) | ORAL | 1 refills | Status: DC

## 2021-10-20 MED ORDER — DESVENLAFAXINE SUCCINATE ER 100 MG PO TB24: 200.0000 mg | ORAL_TABLET | Freq: Every day | ORAL | 1 refills | Status: DC

## 2021-10-20 NOTE — Progress Notes (Signed)
[facility]  Patient ID: Mary Barrera,  MRN: [id number]  PCP: Mila Palmer, MD  Date of Evaluation: 10/20/2021 Time spent:20 minutes  Chief Complaint:  Chief Complaint   Anxiety; Depression; Follow-up    HISTORY/CURRENT STATUS: For routine 38-month med check.   Patient denies loss of interest in usual activities and is able to enjoy things.  Denies decreased energy.  Denies decreased motivation.  Work is going well.  ADLs and personal hygiene are normal.  Appetite has not changed.  Weight is stable.  No extreme sadness, tearfulness, or feelings of hopelessness.  Sleeps well but has to take Benadryl every night. Denies any changes in concentration, making decisions or remembering things. Does need Xanax bid. It is still effective.  Denies suicidal or homicidal thoughts.  No increased energy with decreased need for sleep, no increased talkativeness, no racing thoughts, no impulsivity or risky behaviors, no increased spending, no increased libido, no grandiosity, no increased irritability or anger, no paranoia, and no hallucinations.  Denies dizziness, syncope, seizures, numbness, tingling, tremor, tics, unsteady gait, slurred speech, confusion. Denies muscle or joint pain, stiffness, or dystonia.  Individual Medical History/ Review of Systems: Changes? :No    Past medications for mental health diagnoses include: Gabapentin for back pain caused tremor, Klonopin helped but caused memory issues, Celexa, and 1 other that she cannot remember.  States Pristiq has been the best thing that she has taken.  Allergies: Humalog [insulin lispro], Atorvastatin, Hydrocodone-acetaminophen, Oxycodone, Simvastatin, and Tramadol  Current Medications:  Current Outpatient Medications:    ALPRAZolam (XANAX) 0.5 MG tablet, Take 1 tablet (0.5 mg total) by mouth 3 (three) times daily as needed for anxiety., Disp: 90 tablet, Rfl: 1   aspirin EC 81 MG tablet, Take 81 mg by mouth daily., Disp: ,  Rfl:    busPIRone (BUSPAR) 30 MG tablet, TAKE 1 TABLET BY MOUTH TWICE A DAY, Disp: 180 tablet, Rfl: 1   celecoxib (CELEBREX) 200 MG capsule, Take 200 mg by mouth daily., Disp: , Rfl: 5   desvenlafaxine (PRISTIQ) 100 MG 24 hr tablet, Take 2 tablets by mouth once daily, Disp: 60 tablet, Rfl: 0   diphenhydrAMINE (BENADRYL) 25 mg capsule, Take 25 mg by mouth every 8 (eight) hours as needed for sleep., Disp: , Rfl:    fluticasone (FLONASE) 50 MCG/ACT nasal spray, Place 2 sprays into both nostrils 2 (two) times daily as needed for allergies., Disp: , Rfl: 3   Insulin Glargine (BASAGLAR KWIKPEN) 100 UNIT/ML SOPN, INJ 30 UNITS [place] QHS UTD, Disp: , Rfl:    ramipril (ALTACE) 5 MG capsule, Take 5 mg by mouth at bedtime., Disp: , Rfl: 4   NOREL AD 4-10-325 MG TABS, Take 1 tablet by mouth every 8 (eight) hours as needed. (Patient not taking: Reported on 10/20/2021), Disp: , Rfl:    OZEMPIC, 0.25 OR 0.5 MG/DOSE, 2 MG/1.5ML SOPN, , Disp: , Rfl:    rosuvastatin (CRESTOR) 10 MG tablet, , Disp: , Rfl:    SYMBICORT 160-4.5 MCG/ACT inhaler, SMARTSIG:2 Puff(s) By Mouth Twice Daily (Patient not taking: Reported on 04/21/2021), Disp: , Rfl:    VITAMIN D, CHOLECALCIFEROL, PO, Take 2 tablets by mouth daily. (Patient not taking: Reported on 04/21/2021), Disp: , Rfl:  Medication Side Effects: none  Family Medical/ Social History: Changes? Got engaged!   MENTAL HEALTH EXAM:  There were no vitals taken for this visit.There is no height or weight on file to calculate BMI.  General Appearance: Casual, Well Groomed and Obese  Eye  Contact:  Good  Speech:  Clear and Coherent and Normal Rate  Volume:  Normal  Mood:  Euthymic  Affect:  Congruent  Thought Process:  Goal Directed and Descriptions of Associations: Intact  Orientation:  Full (Time, Place, and Person)  Thought Content: Logical   Suicidal Thoughts:  No  Homicidal Thoughts:  No  Memory:  WNL  Judgement:  Good  Insight:  Good  Psychomotor Activity:  Normal   Concentration:  Concentration: Good and Attention Span: Good  Recall:  Good  Fund of Knowledge: Good  Language: Good  Assets:  Desire for Improvement  ADL's:  Intact  Cognition: WNL  Prognosis:  Good    DIAGNOSES:    ICD-10-CM   1. Recurrent major depressive disorder, in partial remission (HCC)  F33.41     2. Generalized anxiety disorder  F41.1       Receiving Psychotherapy: Yes  [name]     RECOMMENDATIONS:  PDMP reviewed.  Modafnil filled 05/07/2020. I provided 20 minutes of face to face time during this encounter, including time spent before and after the visit in records review, medical decision making, counseling pertinent to today's visit, and charting.   Congrats on her engagement!  She is doing well so no change in meds. Continue Xanax 0.5 mg, 1 p.o. 3 times daily as needed.  Cont BuSpar to 30 mg p.o. twice daily. Continue  Pristiq 100 mg, 1.5 pills qd. (Rx written for 2)  Continue therapy. Return in 6 months.  Melony Overly, PA-C

## 2022-04-22 ENCOUNTER — Ambulatory Visit (INDEPENDENT_AMBULATORY_CARE_PROVIDER_SITE_OTHER): Payer: Commercial Managed Care - HMO | Admitting: Physician Assistant

## 2022-04-22 ENCOUNTER — Encounter: Payer: Self-pay | Admitting: Physician Assistant

## 2022-04-22 DIAGNOSIS — F411 Generalized anxiety disorder: Secondary | ICD-10-CM

## 2022-04-22 DIAGNOSIS — F439 Reaction to severe stress, unspecified: Secondary | ICD-10-CM

## 2022-04-22 DIAGNOSIS — F3341 Major depressive disorder, recurrent, in partial remission: Secondary | ICD-10-CM | POA: Diagnosis not present

## 2022-04-22 MED ORDER — BUSPIRONE HCL 30 MG PO TABS: 30.0000 mg | ORAL_TABLET | Freq: Two times a day (BID) | ORAL | 1 refills | Status: DC

## 2022-04-22 MED ORDER — ALPRAZOLAM 0.5 MG PO TABS: 0.5000 mg | ORAL_TABLET | Freq: Three times a day (TID) | ORAL | 1 refills | Status: DC | PRN

## 2022-04-22 MED ORDER — DESVENLAFAXINE SUCCINATE ER 100 MG PO TB24: 200.0000 mg | ORAL_TABLET | Freq: Every day | ORAL | 1 refills | Status: DC

## 2022-04-22 NOTE — Progress Notes (Signed)
[facility]  Patient ID: Mary Barrera,  MRN: [id number]  PCP: Jonathon Jordan, MD  Date of Evaluation: 04/22/2022 Time spent:20 minutes  Chief Complaint:  Chief Complaint   Anxiety; Depression; Follow-up    HISTORY/CURRENT STATUS: For routine 44-month med check.   Had gotten engaged last summer but she called off the wedding, she and ex-fiance are still friends, they've been friends for 50 years. She got her son out of the house last week, so is alone now and loving it. Has been stressed b/c of both these situations for a long time,esp with her son who has SZ and refuses to take meds. She's bent over backwards to help him over the years and now she's trying hard to care for herself.  Still gets anxious depending on the circumstances.  Does not have panic attacks but more a sense of unease.  Xanax does help.  Patient is able to enjoy things.  Energy and motivation are good.  Work is going well.   No extreme sadness, tearfulness, or feelings of hopelessness.  Sleeps well most of the time. ADLs and personal hygiene are normal.   Denies any changes in concentration, making decisions, or remembering things.  Appetite has not changed.  Weight is stable.   Denies suicidal or homicidal thoughts.  Patient denies increased energy with decreased need for sleep, increased talkativeness, racing thoughts, impulsivity or risky behaviors, increased spending, increased libido, grandiosity, increased irritability or anger, paranoia, or hallucinations.  Denies dizziness, syncope, seizures, numbness, tingling, tremor, tics, unsteady gait, slurred speech, confusion. Denies muscle or joint pain, stiffness, or dystonia.  Individual Medical History/ Review of Systems: Changes? :No    Past medications for mental health diagnoses include: Gabapentin for back pain caused tremor, Klonopin helped but caused memory issues, Celexa, and 1 other that she cannot remember.  States Pristiq has been the best  thing that she has taken.  Allergies: Humalog [insulin lispro], Atorvastatin, Hydrocodone-acetaminophen, Oxycodone, Simvastatin, and Tramadol  Current Medications:  Current Outpatient Medications:    aspirin EC 81 MG tablet, Take 81 mg by mouth daily., Disp: , Rfl:    celecoxib (CELEBREX) 200 MG capsule, Take 200 mg by mouth daily., Disp: , Rfl: 5   diphenhydrAMINE (BENADRYL) 25 mg capsule, Take 25 mg by mouth every 8 (eight) hours as needed for sleep., Disp: , Rfl:    fluticasone (FLONASE) 50 MCG/ACT nasal spray, Place 2 sprays into both nostrils 2 (two) times daily as needed for allergies., Disp: , Rfl: 3   Insulin Glargine (BASAGLAR KWIKPEN) 100 UNIT/ML SOPN, INJ 30 UNITS [place] QHS UTD, Disp: , Rfl:    ramipril (ALTACE) 5 MG capsule, Take 5 mg by mouth at bedtime., Disp: , Rfl: 4   ALPRAZolam (XANAX) 0.5 MG tablet, Take 1 tablet (0.5 mg total) by mouth 3 (three) times daily as needed for anxiety., Disp: 90 tablet, Rfl: 1   busPIRone (BUSPAR) 30 MG tablet, Take 1 tablet (30 mg total) by mouth 2 (two) times daily., Disp: 180 tablet, Rfl: 1   desvenlafaxine (PRISTIQ) 100 MG 24 hr tablet, Take 2 tablets (200 mg total) by mouth daily., Disp: 180 tablet, Rfl: 1   NOREL AD 4-10-325 MG TABS, Take 1 tablet by mouth every 8 (eight) hours as needed. (Patient not taking: Reported on 10/20/2021), Disp: , Rfl:    OZEMPIC, 0.25 OR 0.5 MG/DOSE, 2 MG/1.5ML SOPN, , Disp: , Rfl:    rosuvastatin (CRESTOR) 10 MG tablet, , Disp: , Rfl:    SYMBICORT  160-4.5 MCG/ACT inhaler, SMARTSIG:2 Puff(s) By Mouth Twice Daily (Patient not taking: Reported on 04/21/2021), Disp: , Rfl:    VITAMIN D, CHOLECALCIFEROL, PO, Take 2 tablets by mouth daily. (Patient not taking: Reported on 04/21/2021), Disp: , Rfl:  Medication Side Effects: none  Family Medical/ Social History: Changes? See HPI  MENTAL HEALTH EXAM:  There were no vitals taken for this visit.There is no height or weight on file to calculate BMI.  General Appearance:  Casual, Well Groomed and Obese  Eye Contact:  Good  Speech:  Clear and Coherent and Normal Rate  Volume:  Normal  Mood:  Euthymic  Affect:  Congruent  Thought Process:  Goal Directed and Descriptions of Associations: Circumstantial  Orientation:  Full (Time, Place, and Person)  Thought Content: Logical   Suicidal Thoughts:  No  Homicidal Thoughts:  No  Memory:  WNL  Judgement:  Good  Insight:  Good  Psychomotor Activity:  Normal  Concentration:  Concentration: Good and Attention Span: Good  Recall:  Good  Fund of Knowledge: Good  Language: Good  Assets:  Desire for Improvement Financial Resources/Insurance Housing Transportation Vocational/Educational  ADL's:  Intact  Cognition: WNL  Prognosis:  Good   DIAGNOSES:    ICD-10-CM   1. Recurrent major depressive disorder, in partial remission ([place])  F33.41     2. Generalized anxiety disorder  F41.1     3. Stress at home  F43.9       Receiving Psychotherapy: Yes  Jennye Moccasin    RECOMMENDATIONS:  PDMP reviewed.  Modafnil filled 05/07/2020. I provided 20 minutes of face to face time during this encounter, including time spent before and after the visit in records review, medical decision making, counseling pertinent to today's visit, and charting.   She is doing well even under the stress.  No changes will be made.  Continue Xanax 0.5 mg, 1 p.o. 3 times daily as needed.  Cont BuSpar 30 mg p.o. twice daily. Continue  Pristiq 100 mg, 1.5 pills qd. (Rx written for 2)  Continue therapy. Return in 6 months.  Donnal Moat, PA-C

## 2022-07-02 ENCOUNTER — Other Ambulatory Visit: Payer: Self-pay | Admitting: Physician Assistant

## 2022-08-12 ENCOUNTER — Other Ambulatory Visit: Payer: Self-pay | Admitting: Physician Assistant

## 2022-10-21 ENCOUNTER — Ambulatory Visit: Payer: Commercial Managed Care - HMO | Admitting: Physician Assistant

## 2022-11-20 ENCOUNTER — Telehealth: Payer: Self-pay | Admitting: Physician Assistant

## 2022-11-20 NOTE — Telephone Encounter (Signed)
[facility] called today at 9:52 to report that we don't take her insurance and since the cost is high so won't be able to continue coming here.  She wants to talk with you just to explain  She feels she has a good report with you and just wants you to know what is going on and to say good bye.  Please call.

## 2022-11-24 ENCOUNTER — Other Ambulatory Visit: Payer: Self-pay | Admitting: Physician Assistant

## 2022-11-24 NOTE — Telephone Encounter (Signed)
I returned pt's call. I wished her all the best and told her if she would ever like to continue care here, I'd be more than happy to see her.

## 2022-11-25 NOTE — Telephone Encounter (Signed)
 Please call to schedule an appt, was due in July.

## 2022-12-11 NOTE — Telephone Encounter (Signed)
Patient rtc she has new insurance and CR isn't covered

## 2022-12-11 NOTE — Telephone Encounter (Signed)
Lvm to schedule f/u

## 2023-01-23 ENCOUNTER — Other Ambulatory Visit: Payer: Self-pay | Admitting: Physician Assistant

## 2023-07-13 ENCOUNTER — Inpatient Hospital Stay (HOSPITAL_COMMUNITY)
Admission: EM | Admit: 2023-07-13 | Discharge: 2023-07-20 | DRG: 638 | Disposition: A | Discharge status: Home / Self Care | Attending: Internal Medicine | Admitting: Internal Medicine

## 2023-07-13 ENCOUNTER — Emergency Department (HOSPITAL_COMMUNITY)

## 2023-07-13 ENCOUNTER — Encounter (HOSPITAL_COMMUNITY): Payer: Self-pay | Admitting: Radiology

## 2023-07-13 ENCOUNTER — Other Ambulatory Visit: Payer: Self-pay

## 2023-07-13 DIAGNOSIS — E876 Hypokalemia: Secondary | ICD-10-CM | POA: Diagnosis not present

## 2023-07-13 DIAGNOSIS — Z833 Family history of diabetes mellitus: Secondary | ICD-10-CM

## 2023-07-13 DIAGNOSIS — N179 Acute kidney failure, unspecified: Secondary | ICD-10-CM

## 2023-07-13 DIAGNOSIS — Z9181 History of falling: Secondary | ICD-10-CM

## 2023-07-13 DIAGNOSIS — E871 Hypo-osmolality and hyponatremia: Secondary | ICD-10-CM | POA: Diagnosis not present

## 2023-07-13 DIAGNOSIS — Z7982 Long term (current) use of aspirin: Secondary | ICD-10-CM

## 2023-07-13 DIAGNOSIS — Z803 Family history of malignant neoplasm of breast: Secondary | ICD-10-CM

## 2023-07-13 DIAGNOSIS — R296 Repeated falls: Secondary | ICD-10-CM | POA: Diagnosis present

## 2023-07-13 DIAGNOSIS — Z79899 Other long term (current) drug therapy: Secondary | ICD-10-CM

## 2023-07-13 DIAGNOSIS — R7989 Other specified abnormal findings of blood chemistry: Secondary | ICD-10-CM | POA: Diagnosis present

## 2023-07-13 DIAGNOSIS — Z91148 Patient's other noncompliance with medication regimen for other reason: Secondary | ICD-10-CM

## 2023-07-13 DIAGNOSIS — R0989 Other specified symptoms and signs involving the circulatory and respiratory systems: Secondary | ICD-10-CM | POA: Diagnosis not present

## 2023-07-13 DIAGNOSIS — F411 Generalized anxiety disorder: Secondary | ICD-10-CM

## 2023-07-13 DIAGNOSIS — Z1152 Encounter for screening for COVID-19: Secondary | ICD-10-CM

## 2023-07-13 DIAGNOSIS — E860 Dehydration: Secondary | ICD-10-CM | POA: Diagnosis present

## 2023-07-13 DIAGNOSIS — E1110 Type 2 diabetes mellitus with ketoacidosis without coma: Principal | ICD-10-CM

## 2023-07-13 DIAGNOSIS — S098XXA Other specified injuries of head, initial encounter: Secondary | ICD-10-CM

## 2023-07-13 DIAGNOSIS — R531 Weakness: Secondary | ICD-10-CM | POA: Diagnosis not present

## 2023-07-13 DIAGNOSIS — Z888 Allergy status to other drugs, medicaments and biological substances status: Secondary | ICD-10-CM

## 2023-07-13 DIAGNOSIS — E875 Hyperkalemia: Secondary | ICD-10-CM

## 2023-07-13 DIAGNOSIS — Z880 Allergy status to penicillin: Secondary | ICD-10-CM

## 2023-07-13 DIAGNOSIS — G8929 Other chronic pain: Secondary | ICD-10-CM | POA: Diagnosis present

## 2023-07-13 DIAGNOSIS — Z23 Encounter for immunization: Secondary | ICD-10-CM

## 2023-07-13 DIAGNOSIS — W19XXXA Unspecified fall, initial encounter: Secondary | ICD-10-CM

## 2023-07-13 DIAGNOSIS — Z885 Allergy status to narcotic agent status: Secondary | ICD-10-CM

## 2023-07-13 DIAGNOSIS — F32A Depression, unspecified: Secondary | ICD-10-CM | POA: Diagnosis present

## 2023-07-13 DIAGNOSIS — Z794 Long term (current) use of insulin: Principal | ICD-10-CM

## 2023-07-13 DIAGNOSIS — T383X6A Underdosing of insulin and oral hypoglycemic [antidiabetic] drugs, initial encounter: Secondary | ICD-10-CM | POA: Diagnosis present

## 2023-07-13 DIAGNOSIS — E785 Hyperlipidemia, unspecified: Secondary | ICD-10-CM | POA: Diagnosis present

## 2023-07-13 DIAGNOSIS — Z7951 Long term (current) use of inhaled steroids: Secondary | ICD-10-CM

## 2023-07-13 DIAGNOSIS — Z823 Family history of stroke: Secondary | ICD-10-CM

## 2023-07-13 DIAGNOSIS — M545 Low back pain, unspecified: Secondary | ICD-10-CM | POA: Diagnosis present

## 2023-07-13 DIAGNOSIS — M1990 Unspecified osteoarthritis, unspecified site: Secondary | ICD-10-CM | POA: Diagnosis present

## 2023-07-13 DIAGNOSIS — Z91138 Patient's unintentional underdosing of medication regimen for other reason: Secondary | ICD-10-CM

## 2023-07-13 DIAGNOSIS — F4310 Post-traumatic stress disorder, unspecified: Secondary | ICD-10-CM | POA: Diagnosis present

## 2023-07-13 DIAGNOSIS — Z8249 Family history of ischemic heart disease and other diseases of the circulatory system: Secondary | ICD-10-CM

## 2023-07-13 DIAGNOSIS — I10 Essential (primary) hypertension: Secondary | ICD-10-CM

## 2023-07-13 DIAGNOSIS — Z800 Family history of malignant neoplasm of digestive organs: Secondary | ICD-10-CM

## 2023-07-13 DIAGNOSIS — E1169 Type 2 diabetes mellitus with other specified complication: Principal | ICD-10-CM | POA: Diagnosis present

## 2023-07-13 LAB — BASIC METABOLIC PANEL WITH GFR
Anion gap: 26 — ABNORMAL HIGH (ref 5–15)
BUN: 47 mg/dL — ABNORMAL HIGH (ref 6–20)
CO2: 10 mmol/L — ABNORMAL LOW (ref 22–32)
Calcium: 10.4 mg/dL — ABNORMAL HIGH (ref 8.9–10.3)
Chloride: 96 mmol/L — ABNORMAL LOW (ref 98–111)
Creatinine, Ser: 1.57 mg/dL — ABNORMAL HIGH (ref 0.44–1.00)
GFR, Estimated: 38 mL/min — ABNORMAL LOW (ref >60)
Glucose, Bld: 349 mg/dL — ABNORMAL HIGH (ref 70–99)
Potassium: 5.6 mmol/L — ABNORMAL HIGH (ref 3.5–5.1)
Sodium: 132 mmol/L — ABNORMAL LOW (ref 135–145)

## 2023-07-13 LAB — URINALYSIS, ROUTINE W REFLEX MICROSCOPIC
Bacteria, UA: NONE SEEN
Bilirubin Urine: NEGATIVE
Glucose, UA: >=500 mg/dL — AB
Ketones, ur: 80 mg/dL — AB
Leukocytes,Ua: NEGATIVE
Nitrite: NEGATIVE
Protein, ur: NEGATIVE mg/dL
Specific Gravity, Urine: 1.023 (ref 1.005–1.030)
pH: 5 (ref 5.0–8.0)

## 2023-07-13 LAB — GLUCOSE, CAPILLARY
Glucose-Capillary: 534 mg/dL (ref 70–99)
Glucose-Capillary: 546 mg/dL (ref 70–99)
Glucose-Capillary: 395 mg/dL — ABNORMAL HIGH (ref 70–99)
Glucose-Capillary: 335 mg/dL — ABNORMAL HIGH (ref 70–99)
Glucose-Capillary: 262 mg/dL — ABNORMAL HIGH (ref 70–99)

## 2023-07-13 LAB — CBG MONITORING, ED
Glucose-Capillary: >600 mg/dL (ref 70–99)
Glucose-Capillary: >600 mg/dL (ref 70–99)
Glucose-Capillary: >600 mg/dL (ref 70–99)

## 2023-07-13 LAB — PROCALCITONIN: Procalcitonin: 0.34 ng/mL

## 2023-07-13 LAB — I-STAT CHEM 8, ED
BUN: 58 mg/dL — ABNORMAL HIGH (ref 6–20)
Calcium, Ion: 1.2 mmol/L (ref 1.15–1.40)
Chloride: 95 mmol/L — ABNORMAL LOW (ref 98–111)
Creatinine, Ser: 1.5 mg/dL — ABNORMAL HIGH (ref 0.44–1.00)
Glucose, Bld: >700 mg/dL (ref 70–99)
HCT: 53 % — ABNORMAL HIGH (ref 36.0–46.0)
Hemoglobin: 18 g/dL — ABNORMAL HIGH (ref 12.0–15.0)
Potassium: 6.8 mmol/L (ref 3.5–5.1)
Sodium: 118 mmol/L — CL (ref 135–145)
TCO2: 12 mmol/L — ABNORMAL LOW (ref 22–32)

## 2023-07-13 LAB — RAPID URINE DRUG SCREEN, HOSP PERFORMED
Amphetamines: NOT DETECTED
Barbiturates: NOT DETECTED
Benzodiazepines: NOT DETECTED
Cocaine: NOT DETECTED
Opiates: NOT DETECTED
Tetrahydrocannabinol: NOT DETECTED

## 2023-07-13 LAB — COMPREHENSIVE METABOLIC PANEL WITH GFR
ALT: 15 U/L (ref 0–44)
AST: 12 U/L — ABNORMAL LOW (ref 15–41)
Albumin: 3.8 g/dL (ref 3.5–5.0)
Alkaline Phosphatase: 81 U/L (ref 38–126)
Anion gap: 26 — ABNORMAL HIGH (ref 5–15)
BUN: 48 mg/dL — ABNORMAL HIGH (ref 6–20)
CO2: 10 mmol/L — ABNORMAL LOW (ref 22–32)
Calcium: 10.1 mg/dL (ref 8.9–10.3)
Chloride: 85 mmol/L — ABNORMAL LOW (ref 98–111)
Creatinine, Ser: 1.76 mg/dL — ABNORMAL HIGH (ref 0.44–1.00)
GFR, Estimated: 33 mL/min — ABNORMAL LOW (ref >60)
Glucose, Bld: 833 mg/dL (ref 70–99)
Potassium: 6.3 mmol/L (ref 3.5–5.1)
Sodium: 121 mmol/L — ABNORMAL LOW (ref 135–145)
Total Bilirubin: 1.7 mg/dL — ABNORMAL HIGH (ref 0.0–1.2)
Total Protein: 7.6 g/dL (ref 6.5–8.1)

## 2023-07-13 LAB — RESP PANEL BY RT-PCR (RSV, FLU A&B, COVID)  RVPGX2
Influenza A by PCR: NEGATIVE
Influenza B by PCR: NEGATIVE
Resp Syncytial Virus by PCR: NEGATIVE
SARS Coronavirus 2 by RT PCR: NEGATIVE

## 2023-07-13 LAB — MAGNESIUM
Magnesium: 2.4 mg/dL (ref 1.7–2.4)
Magnesium: 2.4 mg/dL (ref 1.7–2.4)

## 2023-07-13 LAB — OSMOLALITY: Osmolality: 345 mosm/kg (ref 275–295)

## 2023-07-13 LAB — CBC
HCT: 49.6 % — ABNORMAL HIGH (ref 36.0–46.0)
Hemoglobin: 17.7 g/dL — ABNORMAL HIGH (ref 12.0–15.0)
MCH: 30.5 pg (ref 26.0–34.0)
MCHC: 35.7 g/dL (ref 30.0–36.0)
MCV: 85.4 fL (ref 80.0–100.0)
Platelets: 385 10*3/uL (ref 150–400)
RBC: 5.81 MIL/uL — ABNORMAL HIGH (ref 3.87–5.11)
RDW: 11.8 % (ref 11.5–15.5)
WBC: 10.4 10*3/uL (ref 4.0–10.5)
nRBC: 0 % (ref 0.0–0.2)

## 2023-07-13 LAB — BLOOD GAS, VENOUS
Acid-base deficit: 17.6 mmol/L — ABNORMAL HIGH (ref 0.0–2.0)
Bicarbonate: 9.8 mmol/L — ABNORMAL LOW (ref 20.0–28.0)
O2 Saturation: 53.5 %
Patient temperature: 37
pCO2, Ven: 28 mmHg — ABNORMAL LOW (ref 44–60)
pH, Ven: 7.15 — CL (ref 7.25–7.43)
pO2, Ven: 31 mmHg — CL (ref 32–45)

## 2023-07-13 LAB — SODIUM, URINE, RANDOM: Sodium, Ur: 32 mmol/L

## 2023-07-13 LAB — CREATININE, URINE, RANDOM: Creatinine, Urine: 28 mg/dL

## 2023-07-13 LAB — PHOSPHORUS: Phosphorus: 3.7 mg/dL (ref 2.5–4.6)

## 2023-07-13 LAB — TROPONIN I (HIGH SENSITIVITY)
Troponin I (High Sensitivity): 28 ng/L — ABNORMAL HIGH (ref <18)
Troponin I (High Sensitivity): 41 ng/L — ABNORMAL HIGH (ref <18)

## 2023-07-13 LAB — BETA-HYDROXYBUTYRIC ACID
Beta-Hydroxybutyric Acid: >8 mmol/L — ABNORMAL HIGH (ref 0.05–0.27)
Beta-Hydroxybutyric Acid: 6.63 mmol/L — ABNORMAL HIGH (ref 0.05–0.27)

## 2023-07-13 LAB — CK: Total CK: 97 U/L (ref 38–234)

## 2023-07-13 LAB — MRSA NEXT GEN BY PCR, NASAL: MRSA by PCR Next Gen: NOT DETECTED

## 2023-07-13 MED ORDER — CALCIUM GLUCONATE 10 % IV SOLN
INTRAVENOUS | Status: AC
  Filled 2023-07-13: qty 10

## 2023-07-13 MED ORDER — CHLORHEXIDINE GLUCONATE CLOTH 2 % EX PADS
6.0000 | MEDICATED_PAD | Freq: Every day | CUTANEOUS | Status: DC
  Administered 2023-07-13: 6 via TOPICAL

## 2023-07-13 MED ORDER — SODIUM CHLORIDE 0.9 % IV SOLN
INTRAVENOUS | Status: DC
Start: 2023-07-13 — End: 2023-07-14

## 2023-07-13 MED ORDER — ONDANSETRON HCL 4 MG/2ML IJ SOLN
4.0000 mg | Freq: Once | INTRAMUSCULAR | Status: AC
  Administered 2023-07-13: 4 mg via INTRAVENOUS
  Filled 2023-07-13: qty 2

## 2023-07-13 MED ORDER — SODIUM CHLORIDE 0.9 % IV BOLUS
1000.0000 mL | Freq: Once | INTRAVENOUS | Status: AC
  Administered 2023-07-13: 1000 mL via INTRAVENOUS

## 2023-07-13 MED ORDER — INSULIN REGULAR(HUMAN) IN NACL 100-0.9 UT/100ML-% IV SOLN
INTRAVENOUS | Status: DC
  Filled 2023-07-13: qty 100

## 2023-07-13 MED ORDER — DEXTROSE 50 % IV SOLN: 0.0000 mL | INTRAVENOUS | Status: DC | PRN

## 2023-07-13 MED ORDER — CALCIUM GLUCONATE 10 % IV SOLN
1.0000 g | Freq: Once | INTRAVENOUS | Status: AC
  Administered 2023-07-13: 1 g via INTRAVENOUS
  Filled 2023-07-13: qty 10

## 2023-07-13 MED ORDER — CALCIUM GLUCONATE 10 % IV SOLN
1.0000 g | INTRAVENOUS | Status: AC
  Filled 2023-07-13: qty 10

## 2023-07-13 MED ORDER — INSULIN REGULAR(HUMAN) IN NACL 100-0.9 UT/100ML-% IV SOLN
INTRAVENOUS | Status: DC
  Administered 2023-07-13: 12 [IU]/h via INTRAVENOUS
  Administered 2023-07-14: 3.6 [IU]/h via INTRAVENOUS
  Filled 2023-07-13: qty 100

## 2023-07-13 MED ORDER — DEXTROSE IN LACTATED RINGERS 5 % IV SOLN: INTRAVENOUS | Status: DC

## 2023-07-13 MED ORDER — LACTATED RINGERS IV SOLN: INTRAVENOUS | Status: DC

## 2023-07-13 MED ORDER — SODIUM ZIRCONIUM CYCLOSILICATE 10 G PO PACK
10.0000 g | PACK | Freq: Every day | ORAL | Status: DC
  Administered 2023-07-13: 10 g via ORAL
  Filled 2023-07-13: qty 1

## 2023-07-13 MED ORDER — LACTATED RINGERS IV BOLUS
600.0000 mL | Freq: Once | INTRAVENOUS | Status: AC
  Administered 2023-07-13: 600 mL via INTRAVENOUS

## 2023-07-13 MED ORDER — FUROSEMIDE 10 MG/ML IJ SOLN
40.0000 mg | Freq: Once | INTRAMUSCULAR | Status: AC
  Administered 2023-07-13: 40 mg via INTRAVENOUS
  Filled 2023-07-13: qty 4

## 2023-07-13 NOTE — Subjective & Objective (Signed)
 Patient presents from home with multiple falls had not been feeling well for the past 1 week have stopped using her insulin because she has not been feeling well Has given more significant fatigued and sluggish confused On arrival [facility] 475 Appears lethargic Endorses polyuria polydipsia Patient at some point did endorse that she have tried to call her primary care provider but they were not able to fill her prescription as she has not been seen by them for a long time  No head trauma not on blood thinner No fevers or chills no chest pain shortness of breath she does have side some nausea though Never had DKA in the past

## 2023-07-13 NOTE — ED Notes (Signed)
 ED TO INPATIENT HANDOFF REPORT  Name/Age/Gender Mary Barrera 58 y.o. female  Code Status    Code Status Orders  (From admission, onward)           Start     Ordered   07/13/23 1854  Full code  Continuous       Question:  By:  Answer:  Consent: discussion documented in EHR   07/13/23 1856           Code Status History     This patient has a current code status but no historical code status.       Home/SNF/Other Home  Chief Complaint DKA, type 2 (HCC) [E11.10]  Level of Care/Admitting Diagnosis ED Disposition     ED Disposition  Admit   Condition  --   Comment  Hospital Area: [facility] [[id number]]  Level of Care: Stepdown [14]  Admit to SDU based on following criteria: Other see comments  Comments: DKA  May place patient in observation at [facility] or [facility] if equivalent level of care is available:: No  Covid Evaluation: Asymptomatic - no recent exposure (last 10 days) testing not required  Diagnosis: DKA, type 2 [facility]) [[id number]]  Admitting Physician: Therisa Doyne [[id number]]  Attending Physician: Therisa Doyne [[id number]]          Medical History Past Medical History:  Diagnosis Date   Anxiety    Depression    Diabetes mellitus without complication (HCC)    Diabetes mellitus, type II (HCC)    Hyperlipidemia    Hypertension, essential 08/19/2016   Osteoarthritis    PTSD (post-traumatic stress disorder)    Seasonal allergies     Allergies Allergies  Allergen Reactions   Desvenlafaxine Shortness Of Breath   Humalog [Insulin Lispro] Shortness Of Breath   Amoxicillin Other (See Comments)    Yeast infection   Atorvastatin Other (See Comments)    Myalgias   Hydrocodone-Acetaminophen Other (See Comments)    Patient's "head feels tight" and she "feels differently"   Oxycodone Hives and Itching   Simvastatin Itching   Tramadol Itching   Tramadol Hcl     Other Reaction(s): hive, itching    IV  Location/Drains/Wounds Patient Lines/Drains/Airways Status     Active Line/Drains/Airways     Name Placement date Placement time Site Days   Peripheral IV 07/13/23 20 G Left Antecubital 07/13/23  1645  Antecubital  less than 1   Peripheral IV 07/13/23 20 G Right Antecubital 07/13/23  1646  Antecubital  less than 1            Labs/Imaging Results for orders placed or performed during the hospital encounter of 07/13/23 (from the past 48 hours)  CBG monitoring, ED     Status: Abnormal   Collection Time: 07/13/23  4:14 PM  Result Value Ref Range   Glucose-Capillary >600 (HH) 70 - 99 mg/dL    Comment: Glucose reference range applies only to samples taken after fasting for at least 8 hours.  CBC     Status: Abnormal   Collection Time: 07/13/23  4:30 PM  Result Value Ref Range   WBC 10.4 4.0 - 10.5 K/uL   RBC 5.81 (H) 3.87 - 5.11 MIL/uL   Hemoglobin 17.7 (H) 12.0 - 15.0 g/dL   HCT 04.5 (H) 40.9 - 81.1 %   MCV 85.4 80.0 - 100.0 fL   MCH 30.5 26.0 - 34.0 pg   MCHC 35.7 30.0 - 36.0 g/dL   RDW 91.4 78.2 -  15.5 %   Platelets 385 150 - 400 K/uL   nRBC 0.0 0.0 - 0.2 %    Comment: Performed at [facility], [address], [place], [place] [address]  Comprehensive metabolic panel     Status: Abnormal   Collection Time: 07/13/23  4:30 PM  Result Value Ref Range   Sodium 121 (L) 135 - 145 mmol/L   Potassium 6.3 (HH) 3.5 - 5.1 mmol/L    Comment: CRITICAL RESULT CALLED TO, READ BACK BY AND VERIFIED WITH J.NELSON, RN AT 1804 ON 07/13/23 BY N.THOMPSON    Chloride 85 (L) 98 - 111 mmol/L   CO2 10 (L) 22 - 32 mmol/L   Glucose, Bld 833 (HH) 70 - 99 mg/dL    Comment: CRITICAL RESULT CALLED TO, READ BACK BY AND VERIFIED WITH J.NELSON, RN AT 1804 ON 07/13/23 BY N.THOMPSON Glucose reference range applies only to samples taken after fasting for at least 8 hours.    BUN 48 (H) 6 - 20 mg/dL   Creatinine, Ser 6.04 (H) 0.44 - 1.00 mg/dL   Calcium 54.0 8.9 - 98.1 mg/dL   Total  Protein 7.6 6.5 - 8.1 g/dL   Albumin 3.8 3.5 - 5.0 g/dL   AST 12 (L) 15 - 41 U/L   ALT 15 0 - 44 U/L   Alkaline Phosphatase 81 38 - 126 U/L   Total Bilirubin 1.7 (H) 0.0 - 1.2 mg/dL   GFR, Estimated 33 (L) >60 mL/min    Comment: (NOTE) Calculated using the CKD-EPI Creatinine Equation (2021)    Anion gap 26 (H) 5 - 15    Comment: ELECTROLYTES REPEATED TO VERIFY Performed at [facility], [address], [place], [place] [address]   Troponin I (High Sensitivity)     Status: Abnormal   Collection Time: 07/13/23  4:30 PM  Result Value Ref Range   Troponin I (High Sensitivity) 28 (H) <18 ng/L    Comment: (NOTE) Elevated high sensitivity troponin I (hsTnI) values and significant  changes across serial measurements may suggest ACS but many other  chronic and acute conditions are known to elevate hsTnI results.  Refer to the "Links" section for chest pain algorithms and additional  guidance. Performed at [facility], [address], [place], [place] [address]   Magnesium     Status: None   Collection Time: 07/13/23  4:30 PM  Result Value Ref Range   Magnesium 2.4 1.7 - 2.4 mg/dL    Comment: Performed at [facility], [address], [place], [place] [address]  I-stat chem 8, ed     Status: Abnormal   Collection Time: 07/13/23  4:39 PM  Result Value Ref Range   Sodium 118 (LL) 135 - 145 mmol/L   Potassium 6.8 (HH) 3.5 - 5.1 mmol/L   Chloride 95 (L) 98 - 111 mmol/L   BUN 58 (H) 6 - 20 mg/dL   Creatinine, Ser 6.57 (H) 0.44 - 1.00 mg/dL   Glucose, Bld >846 (HH) 70 - 99 mg/dL    Comment: Glucose reference range applies only to samples taken after fasting for at least 8 hours.   Calcium, Ion 1.20 1.15 - 1.40 mmol/L   TCO2 12 (L) 22 - 32 mmol/L   Hemoglobin 18.0 (H) 12.0 - 15.0 g/dL   HCT 96.2 (H) 95.2 - 84.1 %   Comment NOTIFIED PHYSICIAN   Blood gas, venous (at [facility] and [facility])     Status: Abnormal   Collection  Time: 07/13/23   4:43 PM  Result Value Ref Range   pH, Ven 7.15 (LL) 7.25 - 7.43    Comment: CRITICAL RESULT CALLED TO, READ BACK BY AND VERIFIED WITH: J.NELSON, RN AT 1804 ON 07/13/23 BY N.THOMPSON    pCO2, Ven 28 (L) 44 - 60 mmHg   pO2, Ven 31 (LL) 32 - 45 mmHg    Comment: CRITICAL RESULT CALLED TO, READ BACK BY AND VERIFIED WITH: J.NELSON, RN AT 1804 ON 07/13/23 BY N.THOMPSON    Bicarbonate 9.8 (L) 20.0 - 28.0 mmol/L   Acid-base deficit 17.6 (H) 0.0 - 2.0 mmol/L   O2 Saturation 53.5 %   Patient temperature 37.0     Comment: Performed at [facility], [address], [place], [place] [address]  Urinalysis, Routine w reflex microscopic -Urine, Clean Catch     Status: Abnormal   Collection Time: 07/13/23  6:17 PM  Result Value Ref Range   Color, Urine STRAW (A) YELLOW   APPearance CLEAR CLEAR   Specific Gravity, Urine 1.023 1.005 - 1.030   pH 5.0 5.0 - 8.0   Glucose, UA >=500 (A) NEGATIVE mg/dL   Hgb urine dipstick SMALL (A) NEGATIVE   Bilirubin Urine NEGATIVE NEGATIVE   Ketones, ur 80 (A) NEGATIVE mg/dL   Protein, ur NEGATIVE NEGATIVE mg/dL   Nitrite NEGATIVE NEGATIVE   Leukocytes,Ua NEGATIVE NEGATIVE   RBC / HPF 0-5 0 - 5 RBC/hpf   WBC, UA 0-5 0 - 5 WBC/hpf   Bacteria, UA NONE SEEN NONE SEEN   Squamous Epithelial / HPF 0-5 0 - 5 /HPF   Mucus PRESENT     Comment: Performed at [facility], [address] [place], [place] [address]  CBG monitoring, ED     Status: Abnormal   Collection Time: 07/13/23  6:23 PM  Result Value Ref Range   Glucose-Capillary >600 (HH) 70 - 99 mg/dL    Comment: Glucose reference range applies only to samples taken after fasting for at least 8 hours.  CBG monitoring, ED     Status: Abnormal   Collection Time: 07/13/23  6:56 PM  Result Value Ref Range   Glucose-Capillary >600 (HH) 70 - 99 mg/dL    Comment: Glucose reference range applies only to samples taken after fasting for at least 8 hours.   CT Head Wo Contrast Result  Date: 07/13/2023 CLINICAL DATA:  Head trauma, abnormal mental status (Age 85-64y) multiple falls in the last three days with head trauma, dizziness, light headedness, nausea. Multiple recent falls. Dizziness, lightheadedness, and nausea. EXAM: CT HEAD WITHOUT CONTRAST TECHNIQUE: Contiguous axial images were obtained from the base of the skull through the vertex without intravenous contrast. RADIATION DOSE REDUCTION: This exam was performed according to the departmental dose-optimization program which includes automated exposure control, adjustment of the mA and/or kV according to patient size and/or use of iterative reconstruction technique. COMPARISON:  None Available. FINDINGS: Brain: There is no evidence of an acute infarct, intracranial hemorrhage, mass, midline shift, or extra-axial fluid collection. Cerebral volume is within normal limits for age with normal size of the ventricles. Cerebral white matter hypodensities are nonspecific but compatible with mild chronic small vessel ischemic disease. Vascular: No hyperdense vessel. Skull: No acute fracture or suspicious lesion. Sinuses/Orbits: Visualized paranasal sinuses and mastoid air cells SCRATCH the visualized paranasal sinuses are clear. Trace left mastoid fluid. Bilateral cataract extraction. Other: None. IMPRESSION: 1. No evidence of acute intracranial abnormality. 2. Mild chronic small vessel ischemic disease. Electronically  Signed   By: Aundra Lee M.D.   On: 07/13/2023 18:41    Pending Labs Unresulted Labs (From admission, onward)     Start     Ordered   07/13/23 2100  Blood gas, venous  Once,   R        07/13/23 1851   07/13/23 1858  Rapid urine drug screen (hospital performed)  ONCE - STAT,   STAT        07/13/23 1857   07/13/23 1858  Procalcitonin  Add-on,   AD       References:    Procalcitonin Lower Respiratory Tract Infection AND Sepsis Procalcitonin Algorithm   07/13/23 1857   07/13/23 1858  TSH  Add-on,   AD        07/13/23 1857    07/13/23 1858  Sodium, urine, random  Once,   R        07/13/23 1857   07/13/23 1858  Osmolality, urine  Once,   R        07/13/23 1857   07/13/23 1858  Creatinine, urine, random  Once,   R        07/13/23 1857   07/13/23 1858  CK  Add-on,   AD        07/13/23 1857   07/13/23 1858  Phosphorus  Add-on,   AD        07/13/23 1857   07/13/23 1721  Osmolality  (Hyperglycemic Hyperosmolar State (HHS))  Once,   URGENT        07/13/23 1722   07/13/23 1621  Beta-hydroxybutyric acid  Once,   URGENT        07/13/23 1621   Signed and Held  HIV Antibody (routine testing w rflx)  (HIV Antibody (Routine testing w reflex) panel)  Once,   R        Signed and Held   Signed and Held  Basic metabolic panel  (Diabetes Ketoacidosis (DKA))  STAT Now then every 4 hours ,   R      Signed and Held   Signed and Held  Beta-hydroxybutyric acid  (Diabetes Ketoacidosis (DKA))  Now then every 4 hours,   R      Signed and Held   Signed and Held  Hemoglobin A1c  (Diabetes Ketoacidosis (DKA))  Add-on,   R       Comments: To assess prior glycemic control.    Signed and Held   Signed and Held  CBC with Differential  (Diabetes Ketoacidosis (DKA))  Tomorrow morning,   R        Signed and Held   Signed and Held  Magnesium  (Diabetes Ketoacidosis (DKA))  Tomorrow morning,   R        Signed and Held   Signed and Held  Phosphorus  (Diabetes Ketoacidosis (DKA))  Tomorrow morning,   R        Signed and Held            Vitals/Pain Today's Vitals   07/13/23 1601 07/13/23 1604 07/13/23 1900  BP: (!) 168/96  (!) 165/107  Pulse: 88 89 97  Resp:  16 14  Temp:  98.1 F (36.7 C)   TempSrc:  Rectal   SpO2: 100% 100% 100%  Weight: 81.2 kg    Height: 5\' 6"  (1.676 m)    PainSc: 0-No pain      Isolation Precautions No active isolations  Medications Medications  sodium zirconium cyclosilicate (LOKELMA) packet 10 g (10 g  Oral Given 07/13/23 1731)  insulin regular, human (MYXREDLIN) 100 units/ 100 mL infusion (12  Units/hr Intravenous New Bag/Given 07/13/23 1750)  lactated ringers infusion ( Intravenous New Bag/Given 07/13/23 1906)  dextrose 50 % solution 0-50 mL (has no administration in time range)  calcium gluconate inj 10% (1 g) URGENT USE ONLY! ( Intravenous Canceled Entry 07/13/23 1800)  ondansetron (ZOFRAN) injection 4 mg (4 mg Intravenous Given 07/13/23 1731)  sodium chloride 0.9 % bolus 1,000 mL (1,000 mLs Intravenous New Bag/Given 07/13/23 1647)  furosemide (LASIX) injection 40 mg (40 mg Intravenous Given 07/13/23 1731)  calcium gluconate inj 10% (1 g) URGENT USE ONLY! (1 g Intravenous Given 07/13/23 1731)  lactated ringers bolus 600 mL (0 mLs Intravenous Stopped 07/13/23 1906)    Mobility walks with person assist

## 2023-07-13 NOTE — Assessment & Plan Note (Signed)
 will admit per DKA  protocol, obtain serial BMET, start on glucosestabalizer, aggressive IVF.   So far work up of possible causes of DKA/HSS with CXR, ECG one set of cardiac enzymes, UA.  have been ordered  Most likely cause been noncompliance Monitor in Stepdown. Replace potassium as needed.      Consult diabetes coordinator

## 2023-07-13 NOTE — Assessment & Plan Note (Signed)
 In the setting of DKA but was corrected by emergency department in the setting of peaked T waves monitor closely patient may need potassium replacement in the future

## 2023-07-13 NOTE — ED Provider Notes (Signed)
 [place] EMERGENCY DEPARTMENT AT [facility] Provider Note   CSN: [id number] Arrival date & time: 07/13/23  1549     History  Chief Complaint  Patient presents with   Fall   Hyperglycemia    Mary Barrera is a 58 y.o. female.  Patient is a 58 year old female with past medical history of diabetes presenting for multiple falls over the last 4 days, generalized weakness, lethargy, polyuria, and polydipsia after being out of her insulin for several weeks.  She states she called her primary care office and they will not fill her prescriptions until she has a appointment with them due to prolonged time without any checkup appointments.  From her falls she denies any bony tenderness.  Admits to blunt head trauma.  Last fall was this morning.  No blood thinner use.  As for the hyperglycemia, she denies any infectious signs or symptoms including no fevers, chills, abdominal pain, vomiting, diarrhea, or dysuria.  She does admit to nausea.  Denies previous history of DKA or admission for diabetes.  The history is provided by the patient. No language interpreter was used.  Fall Pertinent negatives include no chest pain, no abdominal pain and no shortness of breath.  Hyperglycemia Associated symptoms: dizziness, nausea and weakness   Associated symptoms: no abdominal pain, no chest pain, no dysuria, no fever, no shortness of breath and no vomiting        Home Medications Prior to Admission medications   Medication Sig Start Date End Date Taking? Authorizing Provider  ALPRAZolam Prudy Feeler) 0.5 MG tablet Take 1 tablet (0.5 mg total) by mouth 3 (three) times daily as needed for anxiety. 04/22/22   Cherie Ouch, PA-C  aspirin EC 81 MG tablet Take 81 mg by mouth daily.    [provider]  busPIRone (BUSPAR) 30 MG tablet Take 1 tablet (30 mg total) by mouth 2 (two) times daily. 04/22/22   Melony Overly T, PA-C  celecoxib (CELEBREX) 200 MG capsule Take 200 mg by mouth daily.  07/17/16   [provider]  desvenlafaxine (PRISTIQ) 100 MG 24 hr tablet TAKE 2 TABLETS BY MOUTH EVERY DAY 08/14/22   Melony Overly T, PA-C  diphenhydrAMINE (BENADRYL) 25 mg capsule Take 25 mg by mouth every 8 (eight) hours as needed for sleep.    [provider]  fluticasone (FLONASE) 50 MCG/ACT nasal spray Place 2 sprays into both nostrils 2 (two) times daily as needed for allergies. 05/22/16   [provider]  Insulin Glargine (BASAGLAR KWIKPEN) 100 UNIT/ML SOPN INJ 30 UNITS [place] QHS UTD 04/26/17   [provider]  NOREL AD 4-10-325 MG TABS Take 1 tablet by mouth every 8 (eight) hours as needed. Patient not taking: Reported on 10/20/2021 02/06/19   [provider]  OZEMPIC, 0.25 OR 0.5 MG/DOSE, 2 MG/1.5ML SOPN  07/01/18   [provider]  ramipril (ALTACE) 5 MG capsule Take 5 mg by mouth at bedtime. 07/09/16   [provider]  rosuvastatin (CRESTOR) 10 MG tablet  02/04/19   [provider]  SYMBICORT 160-4.5 MCG/ACT inhaler SMARTSIG:2 Puff(s) By Mouth Twice Daily Patient not taking: Reported on 04/21/2021 02/14/19   [provider]  VITAMIN D, CHOLECALCIFEROL, PO Take 2 tablets by mouth daily. Patient not taking: Reported on 04/21/2021    [provider]      Allergies    Desvenlafaxine, Humalog [insulin lispro], Amoxicillin, Atorvastatin, Hydrocodone-acetaminophen, Oxycodone, Simvastatin, Tramadol, and Tramadol hcl    Review of Systems  Review of Systems  Constitutional:  Negative for chills and fever.  HENT:  Negative for ear pain and sore throat.   Eyes:  Negative for pain and visual disturbance.  Respiratory:  Negative for cough and shortness of breath.   Cardiovascular:  Negative for chest pain and palpitations.  Gastrointestinal:  Positive for nausea. Negative for abdominal pain and vomiting.  Genitourinary:  Negative for dysuria and hematuria.  Musculoskeletal:  Negative for arthralgias and back pain.   Skin:  Negative for color change and rash.  Neurological:  Positive for dizziness, weakness and light-headedness. Negative for seizures and syncope.  All other systems reviewed and are negative.   Physical Exam Updated Vital Signs BP (!) 168/96   Pulse 89   Temp 98.1 F (36.7 C) (Rectal)   Resp 16   Ht 5\' 6"  (1.676 m)   Wt 81.2 kg   SpO2 100%   BMI 28.89 kg/m  Physical Exam Vitals and nursing note reviewed.  Constitutional:      General: She is not in acute distress.    Appearance: She is well-developed.  HENT:     Head: Normocephalic and atraumatic.  Eyes:     Conjunctiva/sclera: Conjunctivae normal.  Cardiovascular:     Rate and Rhythm: Normal rate and regular rhythm.     Heart sounds: No murmur heard. Pulmonary:     Effort: Pulmonary effort is normal. No respiratory distress.     Breath sounds: Normal breath sounds.  Abdominal:     Palpations: Abdomen is soft.     Tenderness: There is no abdominal tenderness.  Musculoskeletal:        General: No swelling.     Cervical back: Neck supple.  Skin:    General: Skin is warm and dry.     Capillary Refill: Capillary refill takes less than 2 seconds.  Neurological:     General: No focal deficit present.     Mental Status: She is alert and oriented to person, place, and time.     GCS: GCS eye subscore is 4. GCS verbal subscore is 5. GCS motor subscore is 6.  Psychiatric:        Mood and Affect: Mood normal.     ED Results / Procedures / Treatments   Labs (all labs ordered are listed, but only abnormal results are displayed) Labs Reviewed  CBC - Abnormal; Notable for the following components:      Result Value   RBC 5.81 (*)    Hemoglobin 17.7 (*)    HCT 49.6 (*)    All other components within normal limits  COMPREHENSIVE METABOLIC PANEL WITH GFR - Abnormal; Notable for the following components:   Sodium 121 (*)    Potassium 6.3 (*)    Chloride 85 (*)    CO2 10 (*)    Glucose, Bld 833 (*)    BUN 48 (*)     Creatinine, Ser 1.76 (*)    AST 12 (*)    Total Bilirubin 1.7 (*)    GFR, Estimated 33 (*)    Anion gap 26 (*)    All other components within normal limits  BLOOD GAS, VENOUS - Abnormal; Notable for the following components:   pH, Ven 7.15 (*)    pCO2, Ven 28 (*)    pO2, Ven 31 (*)    Bicarbonate 9.8 (*)    Acid-base deficit 17.6 (*)    All other components within normal limits  CBG MONITORING, ED - Abnormal; Notable for the  following components:   Glucose-Capillary >600 (*)    All other components within normal limits  I-STAT CHEM 8, ED - Abnormal; Notable for the following components:   Sodium 118 (*)    Potassium 6.8 (*)    Chloride 95 (*)    BUN 58 (*)    Creatinine, Ser 1.50 (*)    Glucose, Bld >700 (*)    TCO2 12 (*)    Hemoglobin 18.0 (*)    HCT 53.0 (*)    All other components within normal limits  CBG MONITORING, ED - Abnormal; Notable for the following components:   Glucose-Capillary >600 (*)    All other components within normal limits  TROPONIN I (HIGH SENSITIVITY) - Abnormal; Notable for the following components:   Troponin I (High Sensitivity) 28 (*)    All other components within normal limits  MAGNESIUM  URINALYSIS, ROUTINE W REFLEX MICROSCOPIC  BETA-HYDROXYBUTYRIC ACID  OSMOLALITY  I-STAT CG4 LACTIC ACID, ED  TROPONIN I (HIGH SENSITIVITY)    EKG EKG Interpretation Date/Time:  Tuesday July 13 2023 16:43:26 EDT Ventricular Rate:  87 PR Interval:  159 QRS Duration:  82 QT Interval:  350 QTC Calculation: 421 R Axis:   -21  Text Interpretation: Sinus rhythm Borderline left axis deviation Probable anteroseptal infarct, recent Confirmed by Edwin Dada ([id number]) on 07/13/2023 5:18:18 PM  Radiology No results found.  Procedures .Critical Care  Performed by: Franne Forts, DO Authorized by: Franne Forts, DO   Critical care provider statement:    Critical care time (minutes):  102   Critical care was time spent personally by me on the following  activities:  Development of treatment plan with patient or surrogate, discussions with consultants, evaluation of patient's response to treatment, examination of patient, ordering and review of laboratory studies, ordering and review of radiographic studies, ordering and performing treatments and interventions, pulse oximetry, re-evaluation of patient's condition and review of old charts   Care discussed with: admitting provider       Medications Ordered in ED Medications  sodium zirconium cyclosilicate (LOKELMA) packet 10 g (10 g Oral Given 07/13/23 1731)  insulin regular, human (MYXREDLIN) 100 units/ 100 mL infusion (12 Units/hr Intravenous New Bag/Given 07/13/23 1750)  lactated ringers infusion (has no administration in time range)  dextrose 5 % in lactated ringers infusion (has no administration in time range)  dextrose 50 % solution 0-50 mL (has no administration in time range)  calcium gluconate inj 10% (1 g) URGENT USE ONLY! ( Intravenous Canceled Entry 07/13/23 1800)  ondansetron (ZOFRAN) injection 4 mg (4 mg Intravenous Given 07/13/23 1731)  sodium chloride 0.9 % bolus 1,000 mL (1,000 mLs Intravenous New Bag/Given 07/13/23 1647)  furosemide (LASIX) injection 40 mg (40 mg Intravenous Given 07/13/23 1731)  calcium gluconate inj 10% (1 g) URGENT USE ONLY! (1 g Intravenous Given 07/13/23 1731)  lactated ringers bolus 600 mL (600 mLs Intravenous New Bag/Given 07/13/23 1732)    ED Course/ Medical Decision Making/ A&P                                 Medical Decision Making Amount and/or Complexity of Data Reviewed Labs: ordered. Radiology: ordered.  Risk Prescription drug management. Decision regarding hospitalization.  66:75 PM  58 year old female with past medical history of diabetes presenting for multiple falls over the last 4 days, generalized weakness, lethargy, polyuria, and polydipsia after being out of her insulin for several weeks.  Sodium 121, corrected for hyperglycemia  133-139 depending on chosen algorithm. Glucose 833. Ph 7.1, CO2 10, anion gap of 26.  Potassium 6.8 with some peaked T waves on ECG. Calcium, insulin, ivf, lasix, Lokelma ordered. Insulin ggt for DKA ordered. UA ordered, collected, and pending.  Hydroxybutyrate pending  CT head for blunt head trauma pending.  Otherwise no bony pain or gross exam from the fall.  No leukocytosis or signs or symptoms of infection at this time.  I spoke with admitting physician Dr. Hendrick Locke agrees to accept patient.  Requesting lactic acid and chest x-ray.  Lactic acid and x-ray ordered.        Final Clinical Impression(s) / ED Diagnoses Final diagnoses:  Type 2 diabetes mellitus with other specified complication, with long-term current use of insulin (HCC)  Noncompliance with medications  Hyperkalemia  Hyponatremia  AKI (acute kidney injury) (HCC)  Generalized weakness  Fall, initial encounter  Blunt head trauma, initial encounter  Diabetic ketoacidosis without coma associated with type 2 diabetes mellitus [facility])    Rx / DC Orders ED Discharge Orders     None         Quinn Bucco, DO 07/13/23 1836

## 2023-07-13 NOTE — Assessment & Plan Note (Signed)
 Given AKI hold ramipril Labetalol as needed

## 2023-07-13 NOTE — Assessment & Plan Note (Signed)
 In the setting of dehydration in the setting of DKA obtain electrolytes rehydrate

## 2023-07-13 NOTE — ED Triage Notes (Addendum)
 Pt BIBA from home. Pt fell this AM, was down for unknown amt of time. C/o feeling sluggish over past week.   EMS states there was 13 missing Pristiq pills. Pt states they did not take any more than their regular dose. Does not endorse SI.  CBG was 475

## 2023-07-13 NOTE — Assessment & Plan Note (Signed)
 Corrected sodium 133

## 2023-07-13 NOTE — H&P (Signed)
 Mary Barrera ZOX:[id number] DOB: 12-Sep-1965 DOA: 07/13/2023     PCP: Mila Palmer, MD   Outpatient Specialists: * NONE CARDS: * Dr. None  NEphrology: *  Dr. No care team member to display  NEurology *   Dr. Pulmonary *  Dr.  Oncology * Dr.No care team member to display  GI* Dr.  Deboraha Sprang, [facility]) No care team member to display Urology Dr. *  Patient arrived to ER on 07/13/23 at 1549 Referred by Attending Therisa Doyne, MD   Patient coming from:    home Lives alone,     Chief Complaint:   Chief Complaint  Patient presents with   Fall   Hyperglycemia    HPI: Mary Barrera is a 58 y.o. female with medical history significant of DM 2, and anxiety, chronic back pain, depression    Presented with confusion and repeated falls Patient presents from home with multiple falls had not been feeling well for the past 1 week have stopped using her insulin because she has not been feeling well Has given more significant fatigued and sluggish confused On arrival CBG 475 Appears lethargic Endorses polyuria polydipsia Patient at some point did endorse that she have tried to call her primary care provider but they were not able to fill her prescription as she has not been seen by them for a long time  No head trauma not on blood thinner No fevers or chills no chest pain shortness of breath she does have side some nausea though Never had DKA in the past      Denies significant ETOH intake drinks occasional wine Does smoke  but interested in quitting states has not been smoking for the past 1 week Endorses THC use  No results found for: "SARSCOV2NAA"      Regarding pertinent Chronic problems:     Hyperlipidemia -  not on statins       HTN on ramipril, prazosin      DM 2 -   on insulin,        While in ER:   Found to have evidence of DKA started on insulin drip also noted to have potassium of 6.3 with peaked T waves in ER started her on Lokelma IV calcium and give  a dose of IV Lasix     Lab Orders         CBC         Urinalysis, Routine w reflex microscopic -Urine, Clean Catch         Comprehensive metabolic panel         Blood gas, venous (at [facility])         Beta-hydroxybutyric acid         Magnesium         Osmolality         Blood gas, venous         Rapid urine drug screen (hospital performed)         Procalcitonin         TSH         Sodium, urine, random         Osmolality, urine         Creatinine, urine, random         CK         Phosphorus         CBG monitoring, ED         I-stat chem 8, ed  CBG monitoring, ED         I-Stat CG4 Lactic Acid         CBG monitoring, ED      CT HEAD   NON acute     CXR -ordered    Following Medications were ordered in ER: Medications  sodium zirconium cyclosilicate (LOKELMA) packet 10 g (10 g Oral Given 07/13/23 1731)  insulin regular, human (MYXREDLIN) 100 units/ 100 mL infusion (12 Units/hr Intravenous New Bag/Given 07/13/23 1750)  lactated ringers infusion ( Intravenous New Bag/Given 07/13/23 1906)  dextrose 50 % solution 0-50 mL (has no administration in time range)  calcium gluconate inj 10% (1 g) URGENT USE ONLY! ( Intravenous Canceled Entry 07/13/23 1800)  ondansetron (ZOFRAN) injection 4 mg (4 mg Intravenous Given 07/13/23 1731)  sodium chloride 0.9 % bolus 1,000 mL (1,000 mLs Intravenous New Bag/Given 07/13/23 1647)  furosemide (LASIX) injection 40 mg (40 mg Intravenous Given 07/13/23 1731)  calcium gluconate inj 10% (1 g) URGENT USE ONLY! (1 g Intravenous Given 07/13/23 1731)  lactated ringers bolus 600 mL (0 mLs Intravenous Stopped 07/13/23 1906)       ED Triage Vitals  Encounter Vitals Group     BP 07/13/23 1601 (!) 168/96     Systolic BP Percentile --      Diastolic BP Percentile --      Pulse Rate 07/13/23 1601 88     Resp 07/13/23 1604 16     Temp 07/13/23 1604 98.1 F (36.7 C)     Temp Source 07/13/23 1604 Rectal     SpO2 07/13/23 1601 100 %     Weight 07/13/23  1601 179 lb (81.2 kg)     Height 07/13/23 1601 5\' 6"  (1.676 m)     Head Circumference --      Peak Flow --      Pain Score 07/13/23 1601 0     Pain Loc --      Pain Education --      Exclude from Growth Chart --   WRUE(45)@     _________________________________________ Significant initial  Findings: Abnormal Labs Reviewed  CBC - Abnormal; Notable for the following components:      Result Value   RBC 5.81 (*)    Hemoglobin 17.7 (*)    HCT 49.6 (*)    All other components within normal limits  URINALYSIS, ROUTINE W REFLEX MICROSCOPIC - Abnormal; Notable for the following components:   Color, Urine STRAW (*)    Glucose, UA >=500 (*)    Hgb urine dipstick SMALL (*)    Ketones, ur 80 (*)    All other components within normal limits  COMPREHENSIVE METABOLIC PANEL WITH GFR - Abnormal; Notable for the following components:   Sodium 121 (*)    Potassium 6.3 (*)    Chloride 85 (*)    CO2 10 (*)    Glucose, Bld 833 (*)    BUN 48 (*)    Creatinine, Ser 1.76 (*)    AST 12 (*)    Total Bilirubin 1.7 (*)    GFR, Estimated 33 (*)    Anion gap 26 (*)    All other components within normal limits  BLOOD GAS, VENOUS - Abnormal; Notable for the following components:   pH, Ven 7.15 (*)    pCO2, Ven 28 (*)    pO2, Ven 31 (*)    Bicarbonate 9.8 (*)    Acid-base deficit 17.6 (*)    All other components within normal  limits  CBG MONITORING, ED - Abnormal; Notable for the following components:   Glucose-Capillary >600 (*)    All other components within normal limits  I-STAT CHEM 8, ED - Abnormal; Notable for the following components:   Sodium 118 (*)    Potassium 6.8 (*)    Chloride 95 (*)    BUN 58 (*)    Creatinine, Ser 1.50 (*)    Glucose, Bld >700 (*)    TCO2 12 (*)    Hemoglobin 18.0 (*)    HCT 53.0 (*)    All other components within normal limits  CBG MONITORING, ED - Abnormal; Notable for the following components:   Glucose-Capillary >600 (*)    All other components within  normal limits  CBG MONITORING, ED - Abnormal; Notable for the following components:   Glucose-Capillary >600 (*)    All other components within normal limits  TROPONIN I (HIGH SENSITIVITY) - Abnormal; Notable for the following components:   Troponin I (High Sensitivity) 28 (*)    All other components within normal limits      _________________________ Troponin  ordered Cardiac Panel (last 3 results) Recent Labs    07/13/23 1630  TROPONINIHS 28*    ECG: Ordered Personally reviewed and interpreted by me showing: HR : 87 Rhythm Sinus rhythm Borderline left axis deviation Probable anteroseptal infarct, recent QTC 411    The recent clinical data is shown below. Vitals:   07/13/23 1601 07/13/23 1604 07/13/23 1900  BP: (!) 168/96  (!) 165/107  Pulse: 88 89 97  Resp:  16 14  Temp:  98.1 F (36.7 C)   TempSrc:  Rectal   SpO2: 100% 100% 100%  Weight: 81.2 kg    Height: 5\' 6"  (1.676 m)      WBC     Component Value Date/Time   WBC 10.4 07/13/2023 1630   LYMPHSABS 1.6 01/10/2019 1650   MONOABS 0.6 01/10/2019 1650   EOSABS 0.0 01/10/2019 1650   BASOSABS 0.0 01/10/2019 1650      UA   no evidence of UTI  Urine analysis:    Component Value Date/Time   COLORURINE STRAW (A) 07/13/2023 1817   APPEARANCEUR CLEAR 07/13/2023 1817   LABSPEC 1.023 07/13/2023 1817   PHURINE 5.0 07/13/2023 1817   GLUCOSEU >=500 (A) 07/13/2023 1817   HGBUR SMALL (A) 07/13/2023 1817   BILIRUBINUR NEGATIVE 07/13/2023 1817   KETONESUR 80 (A) 07/13/2023 1817   PROTEINUR NEGATIVE 07/13/2023 1817   NITRITE NEGATIVE 07/13/2023 1817   LEUKOCYTESUR NEGATIVE 07/13/2023 1817    Results for orders placed or performed during the hospital encounter of 01/10/19  Blood Culture (routine x 2)     Status: None   Collection Time: 01/10/19  8:25 PM   Specimen: BLOOD  Result Value Ref Range Status   Specimen Description BLOOD RIGHT ANTECUBITAL  Final   Special Requests   Final    BOTTLES DRAWN AEROBIC AND  ANAEROBIC Blood Culture adequate volume   Culture   Final    NO GROWTH 5 DAYS Performed at [facility], [address], [place], [place] [address]    Report Status 01/15/2019 FINAL  Final    ______________________________________________________________   Venous  Blood Gas result:  pH  7.15 Low Panic   Acid-base deficit 17.6 High  mmol/L   pCO2, Ven 28 Low  mmHg O2 Saturation 53.5 %  pO2, Ven 31 Low Panic  mmHg  Patient temperature 37.0   Bicarbonate 9.8 Low  mmol/L  ABG    Component Value Date/Time   HCO3 9.8 (L) 07/13/2023 1643   TCO2 12 (L) 07/13/2023 1639   ACIDBASEDEF 17.6 (H) 07/13/2023 1643   O2SAT 53.5 07/13/2023 1643    __________________________________________________________ Recent Labs  Lab 07/13/23 1630 07/13/23 1639  NA 121* 118*  K 6.3* 6.8*  CO2 10*  --   GLUCOSE 833* >700*  BUN 48* 58*  CREATININE 1.76* 1.50*  CALCIUM 10.1  --   MG 2.4  --     Cr     Up from baseline see below Lab Results  Component Value Date   CREATININE 1.50 (H) 07/13/2023   CREATININE 1.76 (H) 07/13/2023   CREATININE 1.09 (H) 01/10/2019    Recent Labs  Lab 07/13/23 1630  AST 12*  ALT 15  ALKPHOS 81  BILITOT 1.7*  PROT 7.6  ALBUMIN 3.8   Lab Results  Component Value Date   CALCIUM 10.1 07/13/2023    Plt: Lab Results  Component Value Date   PLT 385 07/13/2023         Recent Labs  Lab 07/13/23 1630 07/13/23 1639  WBC 10.4  --   HGB 17.7* 18.0*  HCT 49.6* 53.0*  MCV 85.4  --   PLT 385  --     HG/HCT Up from baseline see below    Component Value Date/Time   HGB 18.0 (H) 07/13/2023 1639   HCT 53.0 (H) 07/13/2023 1639   MCV 85.4 07/13/2023 1630    _______________________________________________ Hospitalist was called for admission for DKA type 2,   Hyperkalemia, AKI, Fall    The following Work up has been ordered so far:  Orders Placed This Encounter  Procedures   Critical Care   CT Head Wo Contrast   DG Chest Portable 1 View    CBC   Urinalysis, Routine w reflex microscopic -Urine, Clean Catch   Comprehensive metabolic panel   Blood gas, venous (at [facility])   Beta-hydroxybutyric acid   Magnesium   Osmolality   Blood gas, venous   Rapid urine drug screen (hospital performed)   Procalcitonin   TSH   Sodium, urine, random   Osmolality, urine   Creatinine, urine, random   CK   Phosphorus   Diet NPO time specified   ED Cardiac monitoring   Initiate Carrier Fluid Protocol   Notify physician (specify)   If present, discontinue Insulin Pump after IV Insulin is initiated.   Do NOT use lab glucose values in [employer].  If CBG meter reads "Critical High", enter 600.   Upon IV fluid bolus completion, place order for STAT BMET (LAB15) and call provider with results.   K+ > 5 mEq/L and/or K+ addressed separately   Cardiac Monitoring - Continuous Indefinite   Full code   Consult to hospitalist   Consult to diabetes coordinator   Pulse oximetry, continuous   CBG monitoring, ED   I-stat chem 8, ed   CBG monitoring, ED   I-Stat CG4 Lactic Acid   CBG monitoring, ED   ED EKG   EKG 12-Lead   ECHOCARDIOGRAM COMPLETE   Insert peripheral IV   Insert peripheral IV   Place in observation (patient's expected length of stay will be less than 2 midnights)     OTHER Significant initial  Findings:  labs showing:     DM  labs:  HbA1C: No results for input(s): "HGBA1C" in the last 8760 hours.     CBG (last 3)  Recent Labs  07/13/23 1614 07/13/23 1823 07/13/23 1856  GLUCAP >600* >600* >600*    Cultures:    Component Value Date/Time   SDES BLOOD RIGHT ANTECUBITAL 01/10/2019 2025   SPECREQUEST  01/10/2019 2025    BOTTLES DRAWN AEROBIC AND ANAEROBIC Blood Culture adequate volume   CULT  01/10/2019 2025    NO GROWTH 5 DAYS Performed at [facility], [address] [place], [place] [address]    REPTSTATUS 01/15/2019 FINAL 01/10/2019 2025     Radiological Exams on Admission: CT Head Wo  Contrast Result Date: 07/13/2023 CLINICAL DATA:  Head trauma, abnormal mental status (Age 30-64y) multiple falls in the last three days with head trauma, dizziness, light headedness, nausea. Multiple recent falls. Dizziness, lightheadedness, and nausea. EXAM: CT HEAD WITHOUT CONTRAST TECHNIQUE: Contiguous axial images were obtained from the base of the skull through the vertex without intravenous contrast. RADIATION DOSE REDUCTION: This exam was performed according to the departmental dose-optimization program which includes automated exposure control, adjustment of the mA and/or kV according to patient size and/or use of iterative reconstruction technique. COMPARISON:  None Available. FINDINGS: Brain: There is no evidence of an acute infarct, intracranial hemorrhage, mass, midline shift, or extra-axial fluid collection. Cerebral volume is within normal limits for age with normal size of the ventricles. Cerebral white matter hypodensities are nonspecific but compatible with mild chronic small vessel ischemic disease. Vascular: No hyperdense vessel. Skull: No acute fracture or suspicious lesion. Sinuses/Orbits: Visualized paranasal sinuses and mastoid air cells SCRATCH the visualized paranasal sinuses are clear. Trace left mastoid fluid. Bilateral cataract extraction. Other: None. IMPRESSION: 1. No evidence of acute intracranial abnormality. 2. Mild chronic small vessel ischemic disease. Electronically Signed   By: Aundra Lee M.D.   On: 07/13/2023 18:41   _______________________________________________________________________________________________________ Latest  Blood pressure (!) 165/107, pulse 97, temperature 98.1 F (36.7 C), temperature source Rectal, resp. rate 14, height 5\' 6"  (1.676 m), weight 81.2 kg, SpO2 100%.   Vitals  labs and radiology finding personally reviewed  Review of Systems:    Pertinent positives include:   fatigue, falls  nausea, Constitutional:  No weight loss, night sweats,  Fevers, chills, weight loss  HEENT:  No headaches, Difficulty swallowing,Tooth/dental problems,Sore throat,  No sneezing, itching, ear ache, nasal congestion, post nasal drip,  Cardio-vascular:  No chest pain, Orthopnea, PND, anasarca, dizziness, palpitations.no Bilateral lower extremity swelling  GI:  No heartburn, indigestion, abdominal pain, vomiting, diarrhea, change in bowel habits, loss of appetite, melena, blood in stool, hematemesis Resp:  no shortness of breath at rest. No dyspnea on exertion, No excess mucus, no productive cough, No non-productive cough, No coughing up of blood.No change in color of mucus.No wheezing. Skin:  no rash or lesions. No jaundice GU:  no dysuria, change in color of urine, no urgency or frequency. No straining to urinate.  No flank pain.  Musculoskeletal:  No joint pain or no joint swelling. No decreased range of motion. No back pain.  Psych:  No change in mood or affect. No depression or anxiety. No memory loss.  Neuro: no localizing neurological complaints, no tingling, no weakness, no double vision, no gait abnormality, no slurred speech, no confusion  All systems reviewed and apart from HOPI all are negative _______________________________________________________________________________________________ Past Medical History:   Past Medical History:  Diagnosis Date   Anxiety    Depression    Diabetes mellitus without complication (HCC)    Diabetes mellitus, type II (HCC)    Hyperlipidemia    Hypertension, essential 08/19/2016  Osteoarthritis    PTSD (post-traumatic stress disorder)    Seasonal allergies      Past Surgical History:  Procedure Laterality Date   CATARACT EXTRACTION, BILATERAL  05/2017   CESAREAN SECTION      Social History:  Ambulatory   independently       reports that she has never smoked. She has never used smokeless tobacco. She reports that she does not currently use alcohol. She reports that she does not  currently use drugs after having used the following drugs: Marijuana.   Family History:   Family History  Problem Relation Age of Onset   CAD Mother        HTN   Diabetes Mother    Colon cancer Mother    Stroke Mother    Hypertension Mother    Stomach cancer Father    Breast cancer Sister    Aneurysm Sister    Heart attack Maternal Uncle    Aneurysm Maternal Grandmother    _________________________________________________________________________________________ Allergies: Allergies  Allergen Reactions   Desvenlafaxine Shortness Of Breath   Humalog [Insulin Lispro] Shortness Of Breath   Amoxicillin Other (See Comments)    Yeast infection   Atorvastatin Other (See Comments)    Myalgias   Hydrocodone-Acetaminophen Other (See Comments)    Patient's "head feels tight" and she "feels differently"   Oxycodone Hives and Itching   Simvastatin Itching   Tramadol Itching   Tramadol Hcl     Other Reaction(s): hive, itching    Prior to Admission medications   Medication Sig Start Date End Date Taking? Authorizing Provider  ALPRAZolam Prudy Feeler) 0.5 MG tablet Take 1 tablet (0.5 mg total) by mouth 3 (three) times daily as needed for anxiety. 04/22/22   Cherie Ouch, PA-C  aspirin EC 81 MG tablet Take 81 mg by mouth daily.    [provider]  busPIRone (BUSPAR) 30 MG tablet Take 1 tablet (30 mg total) by mouth 2 (two) times daily. 04/22/22   Melony Overly T, PA-C  celecoxib (CELEBREX) 200 MG capsule Take 200 mg by mouth daily. 07/17/16   [provider]  desvenlafaxine (PRISTIQ) 100 MG 24 hr tablet TAKE 2 TABLETS BY MOUTH EVERY DAY 08/14/22   Melony Overly T, PA-C  diphenhydrAMINE (BENADRYL) 25 mg capsule Take 25 mg by mouth every 8 (eight) hours as needed for sleep.    [provider]  fluticasone (FLONASE) 50 MCG/ACT nasal spray Place 2 sprays into both nostrils 2 (two) times daily as needed for allergies. 05/22/16   [provider]  Insulin Glargine  (BASAGLAR KWIKPEN) 100 UNIT/ML SOPN INJ 30 UNITS Tome QHS UTD 04/26/17   [provider]  NOREL AD 4-10-325 MG TABS Take 1 tablet by mouth every 8 (eight) hours as needed. Patient not taking: Reported on 10/20/2021 02/06/19   [provider]  OZEMPIC, 0.25 OR 0.5 MG/DOSE, 2 MG/1.5ML SOPN  07/01/18   [provider]  ramipril (ALTACE) 5 MG capsule Take 5 mg by mouth at bedtime. 07/09/16   [provider]  rosuvastatin (CRESTOR) 10 MG tablet  02/04/19   [provider]  SYMBICORT 160-4.5 MCG/ACT inhaler SMARTSIG:2 Puff(s) By Mouth Twice Daily Patient not taking: Reported on 04/21/2021 02/14/19   [provider]  VITAMIN D, CHOLECALCIFEROL, PO Take 2 tablets by mouth daily. Patient not taking: Reported on 04/21/2021    [provider]    ___________________________________________________________________________________________________ Physical Exam:    07/13/2023    7:00 PM 07/13/2023  4:04 PM 07/13/2023    4:01 PM  Vitals with BMI  Height   5\' 6"   Weight   179 lbs  BMI   28.91  Systolic 165  168  Diastolic 107  96  Pulse 97 89 88     1. General:  in No  Acute distress   Chronically ill -appearing 2. Psychological: lethargic Oriented to self 3. Head/ENT:   Dry Mucous Membranes                          Head Non traumatic, neck supple                       Poor Dentition 4. SKIN:  decreased Skin turgor,  Skin clean Dry and intact no rash    5. Heart: Regular rate and rhythm no  Murmur, no Rub or gallop 6. Lungs: , no wheezes or crackles   7. Abdomen: Soft,  non-tender, Non distended bowel sounds present 8. Lower extremities: no clubbing, cyanosis, no  edema 9. Neurologically Grossly intact, moving all 4 extremities equally   10. MSK: Normal range of motion    Chart has been reviewed  _________________________________________________________________  Assessment/Plan  58 y.o. female with medical history significant of DM 2,  and anxiety, chronic back pain, depression  Admitted forDKA type 2,   Hyperkalemia, AKI, Fall     Present on Admission:  DKA, type 2 (HCC)  Generalized anxiety disorder  Hypertension, essential  Hyponatremia  AKI (acute kidney injury) (HCC)  Hyperkalemia    DKA, type 2 (HCC) will admit per DKA  protocol, obtain serial BMET, start on glucosestabalizer, aggressive IVF.   So far work up of possible causes of DKA/HSS with CXR, ECG one set of cardiac enzymes, UA.  have been ordered  Most likely cause been noncompliance Monitor in Stepdown. Replace potassium as needed.      Consult diabetes coordinator    Generalized anxiety disorder Given lethargy will hold home medications for tonight monitor for any sign of withdrawal restart once safe  Hypertension, essential Given AKI hold ramipril Labetalol as needed  Hyponatremia Corrected sodium 133    AKI (acute kidney injury) (HCC) In the setting of dehydration in the setting of DKA obtain electrolytes rehydrate  Hyperkalemia In the setting of DKA but was corrected by emergency department in the setting of peaked T waves monitor closely patient may need potassium replacement in the future    Other plan as per orders.  DVT prophylaxis:  SCD     Code Status:    Code Status: Full Code FULL CODE  as per patient   I had personally discussed CODE STATUS with patient   ACP   none   Family Communication:   Family not at  Bedside    Diet  Diet Orders (From admission, onward)     Start     Ordered   07/13/23 1720  Diet NPO time specified  Diet effective now        07/13/23 1722            Disposition Plan:     To home once workup is complete and patient is stable   Following barriers for discharge:                          Electrolytes corrected  Consult Orders  (From admission, onward)           Start     Ordered   07/13/23 1807  Consult to hospitalist  Once       Provider:  (Not yet  assigned)  Question Answer Comment  Place call to: [facility]   Reason for Consult Admit      07/13/23 1806                              Diabetes care coordinator                     Consults called: none     Admission status:  ED Disposition     ED Disposition  Admit   Condition  --   Comment  Hospital Area: [facility] [[id number]]  Level of Care: Stepdown [14]  Admit to SDU based on following criteria: Other see comments  Comments: DKA  May place patient in observation at [facility] or [facility] if equivalent level of care is available:: No  Covid Evaluation: Asymptomatic - no recent exposure (last 10 days) testing not required  Diagnosis: DKA, type 2 [facility]) [[id number]]  Admitting Physician: Cashel Bellina [[id number]]  Attending Physician: Laketra Bowdish [[id number]]           Obs      Level of care       stepdown     Ashely Goosby 07/13/2023, 7:47 PM    [facility]     after 2 AM please page floor coverage PA If 7AM-7PM, please contact the day team taking care of the patient using [email or web address]

## 2023-07-13 NOTE — Assessment & Plan Note (Addendum)
 Given lethargy will hold home medications for tonight monitor for any sign of withdrawal restart once safe

## 2023-07-14 ENCOUNTER — Other Ambulatory Visit (HOSPITAL_COMMUNITY): Payer: Self-pay

## 2023-07-14 ENCOUNTER — Observation Stay (HOSPITAL_COMMUNITY)

## 2023-07-14 ENCOUNTER — Telehealth (HOSPITAL_COMMUNITY): Payer: Self-pay | Admitting: Pharmacy Technician

## 2023-07-14 ENCOUNTER — Encounter (HOSPITAL_COMMUNITY): Payer: Self-pay | Admitting: Internal Medicine

## 2023-07-14 DIAGNOSIS — R7989 Other specified abnormal findings of blood chemistry: Secondary | ICD-10-CM | POA: Diagnosis present

## 2023-07-14 DIAGNOSIS — E876 Hypokalemia: Secondary | ICD-10-CM | POA: Diagnosis not present

## 2023-07-14 DIAGNOSIS — F4310 Post-traumatic stress disorder, unspecified: Secondary | ICD-10-CM | POA: Diagnosis present

## 2023-07-14 DIAGNOSIS — M1990 Unspecified osteoarthritis, unspecified site: Secondary | ICD-10-CM | POA: Diagnosis present

## 2023-07-14 DIAGNOSIS — G8929 Other chronic pain: Secondary | ICD-10-CM | POA: Diagnosis present

## 2023-07-14 DIAGNOSIS — Z8249 Family history of ischemic heart disease and other diseases of the circulatory system: Secondary | ICD-10-CM | POA: Diagnosis not present

## 2023-07-14 DIAGNOSIS — E1110 Type 2 diabetes mellitus with ketoacidosis without coma: Secondary | ICD-10-CM | POA: Diagnosis present

## 2023-07-14 DIAGNOSIS — E1169 Type 2 diabetes mellitus with other specified complication: Secondary | ICD-10-CM | POA: Diagnosis present

## 2023-07-14 DIAGNOSIS — M545 Low back pain, unspecified: Secondary | ICD-10-CM | POA: Diagnosis present

## 2023-07-14 DIAGNOSIS — E875 Hyperkalemia: Secondary | ICD-10-CM | POA: Diagnosis present

## 2023-07-14 DIAGNOSIS — Z1152 Encounter for screening for COVID-19: Secondary | ICD-10-CM | POA: Diagnosis not present

## 2023-07-14 DIAGNOSIS — F411 Generalized anxiety disorder: Secondary | ICD-10-CM | POA: Diagnosis present

## 2023-07-14 DIAGNOSIS — E871 Hypo-osmolality and hyponatremia: Secondary | ICD-10-CM | POA: Diagnosis present

## 2023-07-14 DIAGNOSIS — R296 Repeated falls: Secondary | ICD-10-CM | POA: Diagnosis present

## 2023-07-14 DIAGNOSIS — R531 Weakness: Secondary | ICD-10-CM | POA: Diagnosis present

## 2023-07-14 DIAGNOSIS — T383X6A Underdosing of insulin and oral hypoglycemic [antidiabetic] drugs, initial encounter: Secondary | ICD-10-CM | POA: Diagnosis present

## 2023-07-14 DIAGNOSIS — Z794 Long term (current) use of insulin: Secondary | ICD-10-CM | POA: Diagnosis not present

## 2023-07-14 DIAGNOSIS — N179 Acute kidney failure, unspecified: Secondary | ICD-10-CM | POA: Diagnosis present

## 2023-07-14 DIAGNOSIS — E860 Dehydration: Secondary | ICD-10-CM | POA: Diagnosis present

## 2023-07-14 DIAGNOSIS — E785 Hyperlipidemia, unspecified: Secondary | ICD-10-CM | POA: Diagnosis present

## 2023-07-14 DIAGNOSIS — R0989 Other specified symptoms and signs involving the circulatory and respiratory systems: Secondary | ICD-10-CM | POA: Diagnosis not present

## 2023-07-14 DIAGNOSIS — I10 Essential (primary) hypertension: Secondary | ICD-10-CM | POA: Diagnosis present

## 2023-07-14 DIAGNOSIS — F32A Depression, unspecified: Secondary | ICD-10-CM | POA: Diagnosis present

## 2023-07-14 DIAGNOSIS — Z23 Encounter for immunization: Secondary | ICD-10-CM | POA: Diagnosis not present

## 2023-07-14 LAB — BASIC METABOLIC PANEL WITH GFR
Anion gap: 13 (ref 5–15)
Anion gap: 14 (ref 5–15)
Anion gap: 16 — ABNORMAL HIGH (ref 5–15)
Anion gap: 18 — ABNORMAL HIGH (ref 5–15)
BUN: 38 mg/dL — ABNORMAL HIGH (ref 6–20)
BUN: 41 mg/dL — ABNORMAL HIGH (ref 6–20)
BUN: 46 mg/dL — ABNORMAL HIGH (ref 6–20)
BUN: 47 mg/dL — ABNORMAL HIGH (ref 6–20)
CO2: 17 mmol/L — ABNORMAL LOW (ref 22–32)
CO2: 17 mmol/L — ABNORMAL LOW (ref 22–32)
CO2: 19 mmol/L — ABNORMAL LOW (ref 22–32)
CO2: 20 mmol/L — ABNORMAL LOW (ref 22–32)
Calcium: 10.5 mg/dL — ABNORMAL HIGH (ref 8.9–10.3)
Calcium: 9.5 mg/dL (ref 8.9–10.3)
Calcium: 9.7 mg/dL (ref 8.9–10.3)
Calcium: 9.9 mg/dL (ref 8.9–10.3)
Chloride: 96 mmol/L — ABNORMAL LOW (ref 98–111)
Chloride: 97 mmol/L — ABNORMAL LOW (ref 98–111)
Chloride: 99 mmol/L (ref 98–111)
Chloride: 99 mmol/L (ref 98–111)
Creatinine, Ser: 1.06 mg/dL — ABNORMAL HIGH (ref 0.44–1.00)
Creatinine, Ser: 1.17 mg/dL — ABNORMAL HIGH (ref 0.44–1.00)
Creatinine, Ser: 1.34 mg/dL — ABNORMAL HIGH (ref 0.44–1.00)
Creatinine, Ser: 1.36 mg/dL — ABNORMAL HIGH (ref 0.44–1.00)
GFR, Estimated: 45 mL/min — ABNORMAL LOW (ref >60)
GFR, Estimated: 46 mL/min — ABNORMAL LOW (ref >60)
GFR, Estimated: 54 mL/min — ABNORMAL LOW (ref >60)
GFR, Estimated: >60 mL/min (ref >60)
Glucose, Bld: 149 mg/dL — ABNORMAL HIGH (ref 70–99)
Glucose, Bld: 166 mg/dL — ABNORMAL HIGH (ref 70–99)
Glucose, Bld: 193 mg/dL — ABNORMAL HIGH (ref 70–99)
Glucose, Bld: 229 mg/dL — ABNORMAL HIGH (ref 70–99)
Potassium: 3.9 mmol/L (ref 3.5–5.1)
Potassium: 4 mmol/L (ref 3.5–5.1)
Potassium: 4.2 mmol/L (ref 3.5–5.1)
Potassium: 4.6 mmol/L (ref 3.5–5.1)
Sodium: 130 mmol/L — ABNORMAL LOW (ref 135–145)
Sodium: 131 mmol/L — ABNORMAL LOW (ref 135–145)
Sodium: 132 mmol/L — ABNORMAL LOW (ref 135–145)
Sodium: 132 mmol/L — ABNORMAL LOW (ref 135–145)

## 2023-07-14 LAB — ECHOCARDIOGRAM COMPLETE
Area-P 1/2: 3.33 cm2
Height: 66 in
S' Lateral: 1.2 cm
Weight: 2737.23 [oz_av]

## 2023-07-14 LAB — CBC WITH DIFFERENTIAL/PLATELET
Abs Immature Granulocytes: 0.09 10*3/uL — ABNORMAL HIGH (ref 0.00–0.07)
Basophils Absolute: 0 10*3/uL (ref 0.0–0.1)
Basophils Relative: 0 %
Eosinophils Absolute: 0 10*3/uL (ref 0.0–0.5)
Eosinophils Relative: 0 %
HCT: 46.7 % — ABNORMAL HIGH (ref 36.0–46.0)
Hemoglobin: 16.7 g/dL — ABNORMAL HIGH (ref 12.0–15.0)
Immature Granulocytes: 1 %
Lymphocytes Relative: 27 %
Lymphs Abs: 3.4 10*3/uL (ref 0.7–4.0)
MCH: 30.4 pg (ref 26.0–34.0)
MCHC: 35.8 g/dL (ref 30.0–36.0)
MCV: 85.1 fL (ref 80.0–100.0)
Monocytes Absolute: 1.6 10*3/uL — ABNORMAL HIGH (ref 0.1–1.0)
Monocytes Relative: 13 %
Neutro Abs: 7.5 10*3/uL (ref 1.7–7.7)
Neutrophils Relative %: 59 %
Platelets: 316 10*3/uL (ref 150–400)
RBC: 5.49 MIL/uL — ABNORMAL HIGH (ref 3.87–5.11)
RDW: 11.5 % (ref 11.5–15.5)
WBC: 12.6 10*3/uL — ABNORMAL HIGH (ref 4.0–10.5)
nRBC: 0 % (ref 0.0–0.2)

## 2023-07-14 LAB — TSH: TSH: 1.711 u[IU]/mL (ref 0.350–4.500)

## 2023-07-14 LAB — BETA-HYDROXYBUTYRIC ACID
Beta-Hydroxybutyric Acid: 4.7 mmol/L — ABNORMAL HIGH (ref 0.05–0.27)
Beta-Hydroxybutyric Acid: 4.93 mmol/L — ABNORMAL HIGH (ref 0.05–0.27)
Beta-Hydroxybutyric Acid: 2.71 mmol/L — ABNORMAL HIGH (ref 0.05–0.27)
Beta-Hydroxybutyric Acid: 2.93 mmol/L — ABNORMAL HIGH (ref 0.05–0.27)

## 2023-07-14 LAB — GLUCOSE, CAPILLARY
Glucose-Capillary: 143 mg/dL — ABNORMAL HIGH (ref 70–99)
Glucose-Capillary: 144 mg/dL — ABNORMAL HIGH (ref 70–99)
Glucose-Capillary: 157 mg/dL — ABNORMAL HIGH (ref 70–99)
Glucose-Capillary: 166 mg/dL — ABNORMAL HIGH (ref 70–99)
Glucose-Capillary: 167 mg/dL — ABNORMAL HIGH (ref 70–99)
Glucose-Capillary: 174 mg/dL — ABNORMAL HIGH (ref 70–99)
Glucose-Capillary: 181 mg/dL — ABNORMAL HIGH (ref 70–99)
Glucose-Capillary: 182 mg/dL — ABNORMAL HIGH (ref 70–99)
Glucose-Capillary: 188 mg/dL — ABNORMAL HIGH (ref 70–99)
Glucose-Capillary: 228 mg/dL — ABNORMAL HIGH (ref 70–99)
Glucose-Capillary: 240 mg/dL — ABNORMAL HIGH (ref 70–99)
Glucose-Capillary: 297 mg/dL — ABNORMAL HIGH (ref 70–99)
Glucose-Capillary: 346 mg/dL — ABNORMAL HIGH (ref 70–99)

## 2023-07-14 LAB — TROPONIN I (HIGH SENSITIVITY): Troponin I (High Sensitivity): 58 ng/L — ABNORMAL HIGH (ref <18)

## 2023-07-14 LAB — HIV ANTIBODY (ROUTINE TESTING W REFLEX): HIV Screen 4th Generation wRfx: NONREACTIVE

## 2023-07-14 LAB — BLOOD GAS, VENOUS
Acid-base deficit: 8.8 mmol/L — ABNORMAL HIGH (ref 0.0–2.0)
Bicarbonate: 17.4 mmol/L — ABNORMAL LOW (ref 20.0–28.0)
O2 Saturation: 46.5 %
Patient temperature: 37
pCO2, Ven: 38 mmHg — ABNORMAL LOW (ref 44–60)
pH, Ven: 7.27 (ref 7.25–7.43)
pO2, Ven: <31 mmHg — CL (ref 32–45)

## 2023-07-14 LAB — PHOSPHORUS: Phosphorus: 2.8 mg/dL (ref 2.5–4.6)

## 2023-07-14 LAB — OSMOLALITY, URINE: Osmolality, Ur: 589 mosm/kg (ref 300–900)

## 2023-07-14 LAB — MAGNESIUM: Magnesium: 2.1 mg/dL (ref 1.7–2.4)

## 2023-07-14 MED ORDER — ASPIRIN 81 MG PO TBEC
81.0000 mg | DELAYED_RELEASE_TABLET | Freq: Every day | ORAL | Status: DC
  Administered 2023-07-14 – 2023-07-20 (×7): 81 mg via ORAL
  Filled 2023-07-14 (×7): qty 1

## 2023-07-14 MED ORDER — BUSPIRONE HCL 10 MG PO TABS
30.0000 mg | ORAL_TABLET | Freq: Two times a day (BID) | ORAL | Status: DC
  Administered 2023-07-14 – 2023-07-20 (×13): 30 mg via ORAL
  Filled 2023-07-14 (×4): qty 3
  Filled 2023-07-14 (×3): qty 6
  Filled 2023-07-14: qty 3
  Filled 2023-07-14: qty 6
  Filled 2023-07-14 (×2): qty 3
  Filled 2023-07-14: qty 6
  Filled 2023-07-14: qty 3

## 2023-07-14 MED ORDER — ALPRAZOLAM 0.5 MG PO TABS
0.5000 mg | ORAL_TABLET | Freq: Three times a day (TID) | ORAL | Status: DC | PRN
Start: 2023-07-14 — End: 2023-07-20
  Administered 2023-07-14 – 2023-07-19 (×5): 0.5 mg via ORAL
  Filled 2023-07-14 (×5): qty 1

## 2023-07-14 MED ORDER — LACTATED RINGERS IV SOLN: INTRAVENOUS | Status: DC

## 2023-07-14 MED ORDER — DEXTROSE IN LACTATED RINGERS 5 % IV SOLN: INTRAVENOUS | Status: DC | PRN

## 2023-07-14 MED ORDER — INSULIN ASPART 100 UNIT/ML IJ SOLN
0.0000 [IU] | Freq: Three times a day (TID) | INTRAMUSCULAR | Status: DC
  Administered 2023-07-14: 5 [IU] via SUBCUTANEOUS
  Administered 2023-07-14: 2 [IU] via SUBCUTANEOUS
  Administered 2023-07-15: 1 [IU] via SUBCUTANEOUS
  Administered 2023-07-15: 7 [IU] via SUBCUTANEOUS
  Administered 2023-07-15: 5 [IU] via SUBCUTANEOUS
  Administered 2023-07-16: 2 [IU] via SUBCUTANEOUS
  Administered 2023-07-16: 7 [IU] via SUBCUTANEOUS
  Administered 2023-07-16 – 2023-07-17 (×2): 3 [IU] via SUBCUTANEOUS
  Administered 2023-07-17: 7 [IU] via SUBCUTANEOUS
  Administered 2023-07-17: 1 [IU] via SUBCUTANEOUS
  Administered 2023-07-18: 3 [IU] via SUBCUTANEOUS
  Administered 2023-07-18: 2 [IU] via SUBCUTANEOUS
  Administered 2023-07-18: 1 [IU] via SUBCUTANEOUS
  Administered 2023-07-19: 2 [IU] via SUBCUTANEOUS
  Administered 2023-07-19 – 2023-07-20 (×2): 3 [IU] via SUBCUTANEOUS
  Administered 2023-07-20: 2 [IU] via SUBCUTANEOUS

## 2023-07-14 MED ORDER — ENSURE ENLIVE PO LIQD
237.0000 mL | Freq: Two times a day (BID) | ORAL | Status: DC
  Administered 2023-07-16 – 2023-07-20 (×2): 237 mL via ORAL

## 2023-07-14 MED ORDER — RAMIPRIL 5 MG PO CAPS
5.0000 mg | ORAL_CAPSULE | Freq: Every day | ORAL | Status: DC
  Administered 2023-07-14 – 2023-07-20 (×7): 5 mg via ORAL
  Filled 2023-07-14 (×7): qty 1

## 2023-07-14 MED ORDER — PERFLUTREN LIPID MICROSPHERE
1.0000 mL | INTRAVENOUS | Status: AC | PRN
  Administered 2023-07-14: 2 mL via INTRAVENOUS

## 2023-07-14 MED ORDER — LACTATED RINGERS IV SOLN: INTRAVENOUS | Status: AC

## 2023-07-14 MED ORDER — PNEUMOCOCCAL 20-VAL CONJ VACC 0.5 ML IM SUSY
0.5000 mL | PREFILLED_SYRINGE | INTRAMUSCULAR | Status: AC
  Administered 2023-07-15: 0.5 mL via INTRAMUSCULAR
  Filled 2023-07-14: qty 0.5

## 2023-07-14 MED ORDER — INFLUENZA VIRUS VACC SPLIT PF (FLUZONE) 0.5 ML IM SUSY
0.5000 mL | PREFILLED_SYRINGE | INTRAMUSCULAR | Status: DC
  Filled 2023-07-14: qty 0.5

## 2023-07-14 MED ORDER — ORAL CARE MOUTH RINSE: 15.0000 mL | OROMUCOSAL | Status: DC | PRN

## 2023-07-14 MED ORDER — INSULIN GLARGINE-YFGN 100 UNIT/ML ~~LOC~~ SOLN
15.0000 [IU] | Freq: Every day | SUBCUTANEOUS | Status: DC
  Administered 2023-07-14: 15 [IU] via SUBCUTANEOUS
  Filled 2023-07-14 (×2): qty 0.15

## 2023-07-14 MED ORDER — INSULIN ASPART 100 UNIT/ML IJ SOLN
0.0000 [IU] | Freq: Every day | INTRAMUSCULAR | Status: DC
Start: 2023-07-14 — End: 2023-07-20
  Administered 2023-07-14: 4 [IU] via SUBCUTANEOUS
  Administered 2023-07-15: 2 [IU] via SUBCUTANEOUS
  Administered 2023-07-16: 3 [IU] via SUBCUTANEOUS
  Administered 2023-07-19: 2 [IU] via SUBCUTANEOUS

## 2023-07-14 NOTE — Progress Notes (Signed)
 PROGRESS NOTE  Mary Barrera XBJ:[id number] DOB: 09/23/1965 DOA: 07/13/2023 PCP: Mila Palmer, MD   LOS: 0 days   Brief Narrative / Interim history: 58 year old female with DM2, anxiety, chronic back pain comes into the hospital with confusion, repeated falls and overall not feeling well for the past 1 week, including nausea, vomiting, poor p.o. intake.  She is supposed take insulin at home however she stopped using that because she was not been feeling well.  She has been off her insulin for several weeks, she called her PCPs office but prescriptions were not filled due to not being seen for a while.  She was found to be in DKA, placed on insulin infusion and admitted to the stepdown unit  Subjective / 24h Interval events: She is complaining of a dry mouth today.  Complains of generalized weakness and has difficulties standing.  Has no further nausea  Assesement and Plan: Principal Problem:   DKA, type 2 (HCC) Active Problems:   Hypertension, essential   Generalized anxiety disorder   Hyponatremia   AKI (acute kidney injury) (HCC)   Hyperkalemia   Elevated troponin  Principal problem DM 2, with DKA - patient admitted to the hospital in the setting of nonadherence to insulin regimen, which resulted into her going to DKA.  Has been placed on insulin infusion, continue, gap is improving, continue to monitor, anticipate will come off insulin drip this afternoon  Active problems Acute kidney injury - prior creatinine 1.09 but this was 5 years ago in 2020.  Creatinine during this admission was 1.76.  Unknown whether she has a degree of chronic kidney disease.  Creatinine improving with fluids, close to baseline this morning  Essential hypertension -ramipril was on hold due to acute kidney injury.  Resumed this morning due to persistently elevated blood pressure  Hyperkalemia - in the setting of DKA.  Discontinue Lokelma  GAD - continue home medications  Scheduled Meds:  aspirin EC   81 mg Oral Daily   busPIRone  30 mg Oral BID   calcium gluconate  1 g Intravenous STAT   Chlorhexidine Gluconate Cloth  6 each Topical Daily   Continuous Infusions:  dextrose 5% lactated ringers     insulin 3.6 Units/hr (07/14/23 0710)   insulin Stopped (07/13/23 2215)   lactated ringers Stopped (07/14/23 0032)   PRN Meds:.ALPRAZolam, dextrose 5% lactated ringers, dextrose, mouth rinse, perflutren lipid microspheres (DEFINITY) IV suspension  Current Outpatient Medications  Medication Instructions   ALPRAZolam (XANAX) 0.5 mg, Oral, 3 times daily PRN   aspirin EC 81 mg, Daily   busPIRone (BUSPAR) 30 mg, Oral, 2 times daily   celecoxib (CELEBREX) 200 mg, Daily PRN   desvenlafaxine (PRISTIQ) 200 mg, Oral, Daily   diphenhydrAMINE (BENADRYL) 25 mg, Every 8 hours PRN   fluticasone (FLONASE) 50 MCG/ACT nasal spray 2 sprays, 2 times daily PRN   Insulin Glargine (BASAGLAR KWIKPEN) 100 UNIT/ML SOPN INJ 30 UNITS [place] QHS UTD   ramipril (ALTACE) 5 mg, Daily at bedtime    Diet Orders (From admission, onward)     Start     Ordered   07/13/23 2203  Diet NPO time specified  (Diabetes Ketoacidosis (DKA))  Diet effective now        07/13/23 2203            DVT prophylaxis: SCDs Start: 07/13/23 2203   Lab Results  Component Value Date   PLT 316 07/14/2023     Code Status: Full Code  Family Communication: No  family at bedside  Status is: Observation The patient will require care spanning > 2 midnights and should be moved to inpatient because: Send on insulin infusion this morning, persistent weakness, needs PT eval therapy evaluation   Level of care: Stepdown  Consultants:  none  Objective: Vitals:   07/14/23 0800 07/14/23 0810 07/14/23 0921 07/14/23 1000  BP: 100/65  (!) 151/85 (!) 168/78  Pulse:   84 93  Resp: 12   13  Temp:  98.4 F (36.9 C)    TempSrc:  Oral    SpO2:   96% 100%  Weight:      Height:        Intake/Output Summary (Last 24 hours) at 07/14/2023  1009 Last data filed at 07/14/2023 0710 Gross per 24 hour  Intake 3020.69 ml  Output --  Net 3020.69 ml   Wt Readings from Last 3 Encounters:  07/13/23 77.6 kg  02/28/19 94 kg  01/10/19 85.7 kg    Examination:  Constitutional: NAD Eyes: no scleral icterus ENMT: Mucous membranes are moist.  Neck: normal, supple Respiratory: clear to auscultation bilaterally, no wheezing, no crackles.  Cardiovascular: Regular rate and rhythm, no murmurs / rubs / gallops. No LE edema.  Abdomen: non distended, no tenderness. Bowel sounds positive.  Musculoskeletal: no clubbing / cyanosis.    Data Reviewed: I have independently reviewed following labs and imaging studies   CBC Recent Labs  Lab 07/13/23 1630 07/13/23 1639 07/14/23 0311  WBC 10.4  --  12.6*  HGB 17.7* 18.0* 16.7*  HCT 49.6* 53.0* 46.7*  PLT 385  --  316  MCV 85.4  --  85.1  MCH 30.5  --  30.4  MCHC 35.7  --  35.8  RDW 11.8  --  11.5  LYMPHSABS  --   --  3.4  MONOABS  --   --  1.6*  EOSABS  --   --  0.0  BASOSABS  --   --  0.0    Recent Labs  Lab 07/13/23 1630 07/13/23 1639 07/13/23 2105 07/14/23 0009 07/14/23 0311 07/14/23 0601  NA 121* 118* 132* 131* 130* 132*  K 6.3* 6.8* 5.6* 4.2 4.6 3.9  CL 85* 95* 96* 96* 97* 99  CO2 10*  --  10* 17* 17* 19*  GLUCOSE 833* >700* 349* 193* 229* 149*  BUN 48* 58* 47* 47* 46* 41*  CREATININE 1.76* 1.50* 1.57* 1.36* 1.34* 1.17*  CALCIUM 10.1  --  10.4* 10.5* 9.7 9.9  AST 12*  --   --   --   --   --   ALT 15  --   --   --   --   --   ALKPHOS 81  --   --   --   --   --   BILITOT 1.7*  --   --   --   --   --   ALBUMIN 3.8  --   --   --   --   --   MG 2.4  --  2.4  --  2.1  --   PROCALCITON  --   --  0.34  --   --   --   TSH  --   --   --  1.711  --   --     ------------------------------------------------------------------------------------------------------------------ No results for input(s): "CHOL", "HDL", "LDLCALC", "TRIG", "CHOLHDL", "LDLDIRECT" in the last 72  hours.  No results found for: "HGBA1C" ------------------------------------------------------------------------------------------------------------------ Recent Labs    07/14/23 0009  TSH 1.711    Cardiac Enzymes No results for input(s): "CKMB", "TROPONINI", "MYOGLOBIN" in the last 168 hours.  Invalid input(s): "CK" ------------------------------------------------------------------------------------------------------------------    Component Value Date/Time   BNP 15.0 08/12/2016 1248    CBG: Recent Labs  Lab 07/14/23 0439 07/14/23 0542 07/14/23 0653 07/14/23 0805 07/14/23 0859  GLUCAP 240* 182* 174* 144* 143*    Recent Results (from the past 240 hours)  Resp panel by RT-PCR (RSV, Flu A&B, Covid) Anterior Nasal Swab     Status: None   Collection Time: 07/13/23  8:17 PM   Specimen: Anterior Nasal Swab  Result Value Ref Range Status   SARS Coronavirus 2 by RT PCR NEGATIVE NEGATIVE Final    Comment: (NOTE) SARS-CoV-2 target nucleic acids are NOT DETECTED.  The SARS-CoV-2 RNA is generally detectable in upper respiratory specimens during the acute phase of infection. The lowest concentration of SARS-CoV-2 viral copies this assay can detect is 138 copies/mL. A negative result does not preclude SARS-Cov-2 infection and should not be used as the sole basis for treatment or other patient management decisions. A negative result may occur with  improper specimen collection/handling, submission of specimen other than nasopharyngeal swab, presence of viral mutation(s) within the areas targeted by this assay, and inadequate number of viral copies(<138 copies/mL). A negative result must be combined with clinical observations, patient history, and epidemiological information. The expected result is Negative.  Fact Sheet for Patients:  BloggerCourse.com  Fact Sheet for Healthcare Providers:  SeriousBroker.it  This test is no t  yet approved or cleared by the [place] FDA and  has been authorized for detection and/or diagnosis of SARS-CoV-2 by FDA under an Emergency Use Authorization (EUA). This EUA will remain  in effect (meaning this test can be used) for the duration of the COVID-19 declaration under Section 564(b)(1) of the Act, 21 U.S.C.section 360bbb-3(b)(1), unless the authorization is terminated  or revoked sooner.       Influenza A by PCR NEGATIVE NEGATIVE Final   Influenza B by PCR NEGATIVE NEGATIVE Final    Comment: (NOTE) The Xpert Xpress SARS-CoV-2/FLU/RSV plus assay is intended as an aid in the diagnosis of influenza from Nasopharyngeal swab specimens and should not be used as a sole basis for treatment. Nasal washings and aspirates are unacceptable for Xpert Xpress SARS-CoV-2/FLU/RSV testing.  Fact Sheet for Patients: BloggerCourse.com  Fact Sheet for Healthcare Providers: SeriousBroker.it  This test is not yet approved or cleared by the [place] FDA and has been authorized for detection and/or diagnosis of SARS-CoV-2 by FDA under an Emergency Use Authorization (EUA). This EUA will remain in effect (meaning this test can be used) for the duration of the COVID-19 declaration under Section 564(b)(1) of the Act, 21 U.S.C. section 360bbb-3(b)(1), unless the authorization is terminated or revoked.     Resp Syncytial Virus by PCR NEGATIVE NEGATIVE Final    Comment: (NOTE) Fact Sheet for Patients: BloggerCourse.com  Fact Sheet for Healthcare Providers: SeriousBroker.it  This test is not yet approved or cleared by the [place] FDA and has been authorized for detection and/or diagnosis of SARS-CoV-2 by FDA under an Emergency Use Authorization (EUA). This EUA will remain in effect (meaning this test can be used) for the duration of the COVID-19 declaration under Section 564(b)(1)  of the Act, 21 U.S.C. section 360bbb-3(b)(1), unless the authorization is terminated or revoked.  Performed at [facility], [address] [place], [place] [address]   MRSA Next Gen by PCR, Nasal  Status: None   Collection Time: 07/13/23  9:14 PM   Specimen: Nasal Mucosa; Nasal Swab  Result Value Ref Range Status   MRSA by PCR Next Gen NOT DETECTED NOT DETECTED Final    Comment: (NOTE) The GeneXpert MRSA Assay (FDA approved for NASAL specimens only), is one component of a comprehensive MRSA colonization surveillance program. It is not intended to diagnose MRSA infection nor to guide or monitor treatment for MRSA infections. Test performance is not FDA approved in patients less than 66 years old. Performed at [facility], [address], [place], [place] [address]      Radiology Studies: ECHOCARDIOGRAM COMPLETE Result Date: 07/14/2023    ECHOCARDIOGRAM REPORT   Patient Name:   TRYPHENA PERKOVICH Date of Exam: 07/14/2023 Medical Rec #:  [id number]      Height:       66.0 in Accession #:    [id number]     Weight:       171.1 lb Date of Birth:  June 25, 1965       BSA:          1.871 m Patient Age:    57 years       BP:           100/65 mmHg Patient Gender: F              HR:           90 bpm. Exam Location:  Inpatient Procedure: 2D Echo, Cardiac Doppler, Color Doppler and Intracardiac            Opacification Agent (Both Spectral and Color Flow Doppler were            utilized during procedure). Indications:    Elevated Troponin  History:        Patient has no prior history of Echocardiogram examinations.                 Signs/Symptoms:Shortness of Breath; Risk Factors:Hypertension,                 Diabetes and HLD.  Sonographer:    Willey Harrier Referring Phys: [id number] ANASTASSIA DOUTOVA IMPRESSIONS  1. Hyperdynamic LV with dynamic outflow gradient, with valsalva. Given LVH, dynamic obstruction, and reduced e' can consider CMR to evaluate for hypertrophic  cardiomyopathy if clinically indicated. Can also see in hypovolemia and high cardiac output states.. Left ventricular ejection fraction, by estimation, is 70 to 75%. The left ventricle has hyperdynamic function. The left ventricle has no regional wall motion abnormalities. There is moderate concentric left ventricular hypertrophy. Left ventricular diastolic parameters are consistent with Grade I diastolic dysfunction (impaired relaxation).  2. Right ventricular systolic function is normal. The right ventricular size is normal.  3. The mitral valve is normal in structure. No evidence of mitral valve regurgitation. No evidence of mitral stenosis.  4. The aortic valve is tricuspid. Aortic valve regurgitation is not visualized. No aortic stenosis is present.  5. The inferior vena cava is normal in size with greater than 50% respiratory variability, suggesting right atrial pressure of 3 mmHg. FINDINGS  Left Ventricle: Hyperdynamic LV with dynamic outflow gradient, with valsalva. Given LVH, dynamic obstruction, and reduced e' can consider CMR to evaluate for hypertrophic cardiomyopathy if clinically indicated. Can also see in hypovolemia and high cardiac output states. Left ventricular ejection fraction, by estimation, is 70 to 75%. The left ventricle has hyperdynamic function. The left ventricle has no regional wall motion abnormalities. The left  ventricular internal cavity size was normal in size. There is moderate concentric left ventricular hypertrophy. Left ventricular diastolic parameters are consistent with Grade I diastolic dysfunction (impaired relaxation). Right Ventricle: The right ventricular size is normal. No increase in right ventricular wall thickness. Right ventricular systolic function is normal. Left Atrium: Left atrial size was normal in size. Right Atrium: Right atrial size was normal in size. Pericardium: There is no evidence of pericardial effusion. Mitral Valve: The mitral valve is normal in  structure. No evidence of mitral valve regurgitation. No evidence of mitral valve stenosis. MV peak gradient, 3.5 mmHg. The mean mitral valve gradient is 1.0 mmHg. Tricuspid Valve: The tricuspid valve is normal in structure. Tricuspid valve regurgitation is not demonstrated. No evidence of tricuspid stenosis. Aortic Valve: The aortic valve is tricuspid. Aortic valve regurgitation is not visualized. No aortic stenosis is present. Pulmonic Valve: The pulmonic valve was normal in structure. Pulmonic valve regurgitation is not visualized. No evidence of pulmonic stenosis. Aorta: The aortic root and ascending aorta are structurally normal, with no evidence of dilitation. Venous: The inferior vena cava is normal in size with greater than 50% respiratory variability, suggesting right atrial pressure of 3 mmHg. IAS/Shunts: No atrial level shunt detected by color flow Doppler.  LEFT VENTRICLE PLAX 2D LVIDd:         2.40 cm   Diastology LVIDs:         1.20 cm   LV e' medial:    3.15 cm/s LV PW:         1.40 cm   LV E/e' medial:  18.2 LV IVS:        1.60 cm   LV e' lateral:   6.85 cm/s LVOT diam:     2.00 cm   LV E/e' lateral: 8.4 LVOT Area:     3.14 cm  RIGHT VENTRICLE          IVC RV Basal diam:  3.10 cm  IVC diam: 1.40 cm LEFT ATRIUM             Index LA Vol (A2C):   23.0 ml 12.29 ml/m LA Vol (A4C):   18.2 ml 9.73 ml/m LA Biplane Vol: 20.6 ml 11.01 ml/m                        PULMONIC VALVE AORTA                 PV Vmax:       1.90 m/s Ao Root diam: 3.10 cm PV Peak grad:  14.4 mmHg Ao Asc diam:  2.90 cm  MITRAL VALVE MV Area (PHT): 3.33 cm    SHUNTS MV Peak grad:  3.5 mmHg    Systemic Diam: 2.00 cm MV Mean grad:  1.0 mmHg MV Vmax:       0.93 m/s MV Vmean:      49.7 cm/s MV Decel Time: 228 msec MV E velocity: 57.40 cm/s MV A velocity: 66.20 cm/s MV E/A ratio:  0.87 Arta Lark Electronically signed by Arta Lark Signature Date/Time: 07/14/2023/10:01:17 AM    Final    DG Chest Portable 1 View Result Date:  07/13/2023 CLINICAL DATA:  DKA EXAM: PORTABLE CHEST 1 VIEW COMPARISON:  January 10, 2019 FINDINGS: No focal airspace consolidation, pleural effusion, or pneumothorax. No cardiomegaly. No acute fracture or destructive lesion. IMPRESSION: No acute cardiopulmonary abnormality. Electronically Signed   By: Rance Burrows M.D.   On: 07/13/2023 20:08   CT Head  Wo Contrast Result Date: 07/13/2023 CLINICAL DATA:  Head trauma, abnormal mental status (Age 51-64y) multiple falls in the last three days with head trauma, dizziness, light headedness, nausea. Multiple recent falls. Dizziness, lightheadedness, and nausea. EXAM: CT HEAD WITHOUT CONTRAST TECHNIQUE: Contiguous axial images were obtained from the base of the skull through the vertex without intravenous contrast. RADIATION DOSE REDUCTION: This exam was performed according to the departmental dose-optimization program which includes automated exposure control, adjustment of the mA and/or kV according to patient size and/or use of iterative reconstruction technique. COMPARISON:  None Available. FINDINGS: Brain: There is no evidence of an acute infarct, intracranial hemorrhage, mass, midline shift, or extra-axial fluid collection. Cerebral volume is within normal limits for age with normal size of the ventricles. Cerebral white matter hypodensities are nonspecific but compatible with mild chronic small vessel ischemic disease. Vascular: No hyperdense vessel. Skull: No acute fracture or suspicious lesion. Sinuses/Orbits: Visualized paranasal sinuses and mastoid air cells SCRATCH the visualized paranasal sinuses are clear. Trace left mastoid fluid. Bilateral cataract extraction. Other: None. IMPRESSION: 1. No evidence of acute intracranial abnormality. 2. Mild chronic small vessel ischemic disease. Electronically Signed   By: Aundra Lee M.D.   On: 07/13/2023 18:41     Kathlen Para, MD, PhD [facility]  Between 7 am - 7 pm I am available, please contact  me via Amion (for emergencies) or Securechat (non urgent messages)  Between 7 pm - 7 am I am not available, please contact night coverage MD/APP via Amion

## 2023-07-14 NOTE — Telephone Encounter (Signed)
 Patient Product/process development scientist completed.    The patient is insured through [facility]. Patient has [employer], may use a copay card, and/or apply for patient assistance if available.    Ran test claim for [employer] and Requires Prior Authorization.  Ran test claim for [employer] and Product Not Covered   This test claim was processed through [employer]- copay amounts may vary at other pharmacies due to [employer], or as the patient moves through the different stages of their insurance plan.     Morgan Arab, CPHT Pharmacy Technician III Certified Patient Advocate [facility] Pharmacy Patient Advocate Team Direct Number: [phone]  Fax: [phone]

## 2023-07-14 NOTE — Plan of Care (Signed)

## 2023-07-14 NOTE — Plan of Care (Signed)
  Problem: Clinical Measurements: Goal: Diagnostic test results will improve Outcome: Not Progressing   Problem: Nutrition: Goal: Adequate nutrition will be maintained Outcome: Not Progressing   Problem: Metabolic: Goal: Ability to maintain appropriate glucose levels will improve Outcome: Not Progressing   Problem: Nutritional: Goal: Maintenance of adequate nutrition will improve Outcome: Not Progressing Goal: Progress toward achieving an optimal weight will improve Outcome: Not Progressing   Problem: Metabolic: Goal: Ability to maintain appropriate glucose levels will improve Outcome: Not Progressing

## 2023-07-14 NOTE — Assessment & Plan Note (Signed)
 Echo in am Likely type 2

## 2023-07-14 NOTE — Inpatient Diabetes Management (Signed)
 Inpatient Diabetes Program Recommendations  AACE/ADA: New Consensus Statement on Inpatient Glycemic Control (2015)  Target Ranges:  Prepandial:   less than 140 mg/dL      Peak postprandial:   less than 180 mg/dL (1-2 hours)      Critically ill patients:  140 - 180 mg/dL   Lab Results  Component Value Date   GLUCAP 166 (H) 07/14/2023    Review of Glycemic Control  Diabetes history: DM2 Outpatient Diabetes medications: Basaglar 25 at bedtime (has not taken in over 2 weeks) Current orders for Inpatient glycemic control: Semglee 15 units daily, Novolog 0-9 TID with meals and 0-5 HS  HgbA1C pending On [place] diet  Inpatient Diabetes Program Recommendations:    Spoke with pt at bedside regarding her diabetes and glucose control at home. Pt states she has been sick for approx 2 weeks, was not eating, and did not take her insulin. States she hasn't been taking care of herself. Has not been monitoring blood sugars. Discussed importance of checking CBGs and maintaining good CBG control to prevent long-term and short-term complications. Explained how hyperglycemia leads to damage within blood vessels which lead to the common complications seen with uncontrolled diabetes. Stressed to the patient the importance of improving glycemic control to prevent further complications from uncontrolled diabetes. Discussed impact of nutrition, exercise, stress, sickness, and medications on diabetes control. Pt would be good candidate for [facility]. Will check insurance coverage. Stressed importance of f/u with PCP. Pt was appreciative of visit. Will order Living Well with Diabetes book and give CGM info.   Will continue to follow.  Thank you. Joni Net, RD, LDN, CDCES Inpatient Diabetes Coordinator [phone]

## 2023-07-14 NOTE — Progress Notes (Signed)
 PT Cancellation Note  Patient Details Name: Mary Barrera MRN: [id number] DOB: [birth year]   Cancelled Treatment:    Reason Eval/Treat Not Completed: Other (comment) (pt just started eating lunch and requested PT check back later, per RN pt will soon be transferring to 5th floor. Will follow.)   Daymon Evans PT 07/14/2023  Acute Rehabilitation Services  Office [phone]

## 2023-07-15 DIAGNOSIS — E1110 Type 2 diabetes mellitus with ketoacidosis without coma: Secondary | ICD-10-CM | POA: Diagnosis not present

## 2023-07-15 LAB — CBC
HCT: 38.5 % (ref 36.0–46.0)
Hemoglobin: 13.8 g/dL (ref 12.0–15.0)
MCH: 30.7 pg (ref 26.0–34.0)
MCHC: 35.8 g/dL (ref 30.0–36.0)
MCV: 85.6 fL (ref 80.0–100.0)
Platelets: 288 10*3/uL (ref 150–400)
RBC: 4.5 MIL/uL (ref 3.87–5.11)
RDW: 11.9 % (ref 11.5–15.5)
WBC: 9.7 10*3/uL (ref 4.0–10.5)
nRBC: 0 % (ref 0.0–0.2)

## 2023-07-15 LAB — GLUCOSE, CAPILLARY
Glucose-Capillary: 298 mg/dL — ABNORMAL HIGH (ref 70–99)
Glucose-Capillary: 328 mg/dL — ABNORMAL HIGH (ref 70–99)
Glucose-Capillary: 146 mg/dL — ABNORMAL HIGH (ref 70–99)
Glucose-Capillary: 224 mg/dL — ABNORMAL HIGH (ref 70–99)

## 2023-07-15 LAB — COMPREHENSIVE METABOLIC PANEL WITH GFR
ALT: 13 U/L (ref 0–44)
AST: 16 U/L (ref 15–41)
Albumin: 2.8 g/dL — ABNORMAL LOW (ref 3.5–5.0)
Alkaline Phosphatase: 60 U/L (ref 38–126)
Anion gap: 15 (ref 5–15)
BUN: 22 mg/dL — ABNORMAL HIGH (ref 6–20)
CO2: 17 mmol/L — ABNORMAL LOW (ref 22–32)
Calcium: 8.7 mg/dL — ABNORMAL LOW (ref 8.9–10.3)
Chloride: 96 mmol/L — ABNORMAL LOW (ref 98–111)
Creatinine, Ser: 0.92 mg/dL (ref 0.44–1.00)
GFR, Estimated: >60 mL/min (ref >60)
Glucose, Bld: 265 mg/dL — ABNORMAL HIGH (ref 70–99)
Potassium: 3.7 mmol/L (ref 3.5–5.1)
Sodium: 128 mmol/L — ABNORMAL LOW (ref 135–145)
Total Bilirubin: 1.2 mg/dL (ref 0.0–1.2)
Total Protein: 5.6 g/dL — ABNORMAL LOW (ref 6.5–8.1)

## 2023-07-15 LAB — HEMOGLOBIN A1C
Hgb A1c MFr Bld: 14.2 % — ABNORMAL HIGH (ref 4.8–5.6)
Mean Plasma Glucose: 361 mg/dL

## 2023-07-15 LAB — PHOSPHORUS: Phosphorus: 1.5 mg/dL — ABNORMAL LOW (ref 2.5–4.6)

## 2023-07-15 LAB — MAGNESIUM: Magnesium: 2 mg/dL (ref 1.7–2.4)

## 2023-07-15 MED ORDER — POTASSIUM & SODIUM PHOSPHATES 280-160-250 MG PO PACK
1.0000 | PACK | Freq: Three times a day (TID) | ORAL | Status: AC
  Administered 2023-07-15 (×2): 1 via ORAL
  Filled 2023-07-15 (×3): qty 1

## 2023-07-15 MED ORDER — INSULIN ASPART 100 UNIT/ML IJ SOLN: 4.0000 [IU] | Freq: Three times a day (TID) | INTRAMUSCULAR | Status: DC

## 2023-07-15 MED ORDER — POTASSIUM & SODIUM PHOSPHATES 280-160-250 MG PO PACK: 1.0000 | PACK | Freq: Three times a day (TID) | ORAL | Status: DC

## 2023-07-15 MED ORDER — INSULIN GLARGINE-YFGN 100 UNIT/ML ~~LOC~~ SOLN
25.0000 [IU] | Freq: Every day | SUBCUTANEOUS | Status: DC
  Administered 2023-07-15 – 2023-07-16 (×2): 25 [IU] via SUBCUTANEOUS
  Filled 2023-07-15 (×2): qty 0.25

## 2023-07-15 MED ORDER — INSULIN ASPART 100 UNIT/ML IJ SOLN
6.0000 [IU] | Freq: Three times a day (TID) | INTRAMUSCULAR | Status: DC
Start: 2023-07-15 — End: 2023-07-16
  Administered 2023-07-15 – 2023-07-16 (×4): 6 [IU] via SUBCUTANEOUS

## 2023-07-15 NOTE — Progress Notes (Signed)
 Patient ripped out both Ivs. On call Lorre Rosin, NP aware. Care plan continued.

## 2023-07-15 NOTE — Evaluation (Addendum)
 Physical Therapy Evaluation Patient Details Name: Mary Barrera MRN: [id number] DOB: [birth year] Today's Date: 07/15/2023  History of Present Illness  58 year old female admitted with confusion, repeated falls and overall not feeling well for the past 1 week, including nausea, vomiting, poor p.o. intake.  She is supposed take insulin at home however she stopped using that because she was not been feeling well.  She has been off her insulin for several weeks. Dx of DKA. PMH: DM2, anxiety, chronic back pain  Clinical Impression  Pt admitted with above diagnosis. Mod assist for supine to sit, mod to max assist for sitting balance 2* several episodes of significant loss of balance while sitting edge of bed. MOd assist for sit to stand and min/mod assist to pivot with RW to recliner. Ambulation not attempted 2* fatigue and poor balance. Pt presents with decreased safety awareness, decreased activity tolerance, decreased balance, and decreased strength. Patient will benefit from continued inpatient follow up therapy, <3 hours/day.  Pt currently with functional limitations due to the deficits listed below (see PT Problem List). Pt will benefit from acute skilled PT to increase their independence and safety with mobility to allow discharge.           If plan is discharge home, recommend the following: A lot of help with walking and/or transfers;A lot of help with bathing/dressing/bathroom;Assistance with cooking/housework;Assist for transportation;Help with stairs or ramp for entrance   Can travel by private vehicle   Yes    Equipment Recommendations Rolling walker (2 wheels)  Recommendations for Other Services       Functional Status Assessment Patient has had a recent decline in their functional status and demonstrates the ability to make significant improvements in function in a reasonable and predictable amount of time.     Precautions / Restrictions Precautions Precautions: Fall Recall of  Precautions/Restrictions: Impaired Precaution/Restrictions Comments: reports 3 falls in past 6 months; pt unsteady during PT eval Restrictions Weight Bearing Restrictions Per Provider Order: No      Mobility  Bed Mobility Overal bed mobility: Needs Assistance Bed Mobility: Supine to Sit     Supine to sit: Mod assist     General bed mobility comments: assist to raise trunk; pt very unsteady sitting EOB, had several episodes of loss of balance posteriorly and laterally with no attempt to correct/protect, required mod/max assist to return trunk to neutral and to prevent hitting her head on bedrail behind her    Transfers Overall transfer level: Needs assistance Equipment used: Rolling walker (2 wheels) Transfers: Sit to/from Stand, Bed to chair/wheelchair/BSC Sit to Stand: Mod assist   Step pivot transfers: Min assist, Mod assist       General transfer comment: assist to power up, VCs for hand placement, uncontrolled descent to recliner    Ambulation/Gait               General Gait Details: NT 2* poor balance and fatigue with stand pivot transfer  Stairs            Wheelchair Mobility     Tilt Bed    Modified Rankin (Stroke Patients Only)       Balance Overall balance assessment: Needs assistance Sitting-balance support: No upper extremity supported, Feet supported Sitting balance-Leahy Scale: Zero Sitting balance - Comments: intermittently able to maintain trunk in neutral, then had several episodes of loss of balance posteriorly and to L/R requiring mod to max assist to correct Postural control: Posterior lean, Right lateral lean, Left lateral lean  Pertinent Vitals/Pain Pain Assessment Pain Assessment: No/denies pain    Home Living Family/patient expects to be discharged to:: Private residence Living Arrangements: Children Available Help at Discharge: Family;Available PRN/intermittently          Alternate Level Stairs-Number of Steps: flight Home Layout: Two level;Bed/bath upstairs Home Equipment: None Additional Comments: son lives with pt, he works during the day    Prior Function Prior Level of Function : Independent/Modified Independent             Mobility Comments: walks without AD; reports 3 falls in past 6 months (she attributes these to elevated blood sugars) ADLs Comments: independent     Extremity/Trunk Assessment   Upper Extremity Assessment Upper Extremity Assessment: Defer to OT evaluation    Lower Extremity Assessment Lower Extremity Assessment: Generalized weakness;LLE deficits/detail;RLE deficits/detail RLE Deficits / Details: B knee ext 4/5 RLE Sensation: WNL LLE Deficits / Details: B knee ext 4/5 LLE Sensation: WNL    Cervical / Trunk Assessment Cervical / Trunk Assessment: Normal  Communication   Communication Communication: No apparent difficulties    Cognition Arousal: Lethargic Behavior During Therapy: WFL for tasks assessed/performed   PT - Cognitive impairments: Awareness, Problem solving, Attention, Safety/Judgement                       PT - Cognition Comments: decreased safety awareness, pt had significant loss of balance posteriorly while sitting edge of bed and made no attempt to correct/protect herself (max assist to prevent hitting her head on bedrail behind her). Pt oriented to self, location and month/year. Increased time to follow commands and respond to questions. Following commands: Impaired Following commands impaired: Follows one step commands with increased time     Cueing Cueing Techniques: Verbal cues, Tactile cues     General Comments General comments (skin integrity, edema, etc.): In recliner BP 114/77, HR 88, SpO2 97% on room air    Exercises     Assessment/Plan    PT Assessment Patient needs continued PT services  PT Problem List Decreased activity tolerance;Decreased balance;Decreased  mobility;Decreased safety awareness;Decreased knowledge of use of DME       PT Treatment Interventions Gait training;Therapeutic exercise;Therapeutic activities;DME instruction;Functional mobility training;Patient/family education    PT Goals (Current goals can be found in the Care Plan section)  Acute Rehab PT Goals Patient Stated Goal: get strong enough to go home PT Goal Formulation: With patient Time For Goal Achievement: 07/29/23 Potential to Achieve Goals: Good    Frequency Min 3X/week     Co-evaluation               AM-PAC PT "6 Clicks" Mobility  Outcome Measure Help needed turning from your back to your side while in a flat bed without using bedrails?: A Little Help needed moving from lying on your back to sitting on the side of a flat bed without using bedrails?: A Lot Help needed moving to and from a bed to a chair (including a wheelchair)?: A Lot Help needed standing up from a chair using your arms (e.g., wheelchair or bedside chair)?: A Lot Help needed to walk in hospital room?: A Lot Help needed climbing 3-5 steps with a railing? : Total 6 Click Score: 12    End of Session Equipment Utilized During Treatment: Gait belt Activity Tolerance: Patient limited by fatigue Patient left: in chair;with chair alarm set;with call bell/phone within reach (chair alarm box needs new batteries, nurse notified) Nurse Communication: Mobility status PT Visit  Diagnosis: Unsteadiness on feet (R26.81);History of falling (Z91.81);Repeated falls (R29.6);Other abnormalities of gait and mobility (R26.89);Muscle weakness (generalized) (M62.81);Difficulty in walking, not elsewhere classified (R26.2)    Time: 1027-2536 PT Time Calculation (min) (ACUTE ONLY): 20 min   Charges:   PT Evaluation $PT Eval Moderate Complexity: 1 Mod   PT General Charges $$ ACUTE PT VISIT: 1 Visit         Daymon Evans PT 07/15/2023  Acute Rehabilitation Services  Office  [phone]

## 2023-07-15 NOTE — Progress Notes (Signed)
 PROGRESS NOTE  Mary Barrera XLK:[id number] DOB: [birth year] DOA: 07/13/2023 PCP: Mila Palmer, MD   LOS: 1 day   Brief Narrative / Interim history: 58 year old female with DM2, anxiety, chronic back pain comes into the hospital with confusion, repeated falls and overall not feeling well for the past 1 week, including nausea, vomiting, poor p.o. intake.  She is supposed take insulin at home however she stopped using that because she was not been feeling well.  She has been off her insulin for several weeks, she called her PCPs office but prescriptions were not filled due to not being seen for a while.  She was found to be in DKA, placed on insulin infusion and admitted to the stepdown unit  Subjective / 24h Interval events: She feels persistently weak today, concerned about going home yet as her bedroom is upstairs and she feels like she has unknown energy this morning.  She is also concerned about persistently elevated CBGs  Assesement and Plan: Principal Problem:   DKA, type 2 (HCC) Active Problems:   Hypertension, essential   Generalized anxiety disorder   Hyponatremia   AKI (acute kidney injury) (HCC)   Hyperkalemia   Elevated troponin   DKA (diabetic ketoacidosis) (HCC)  Principal problem DM 2, with DKA - patient admitted to the hospital in the setting of nonadherence to insulin regimen, which resulted into her going to DKA.  Has been placed on insulin infusion, gap is closed and CBGs have improved - She was transitioned to subcutaneous regimen but remains persistently hyperglycemic, will increase her glargine and add mealtime insulin today - Advance diet as she is tolerating liquids  Active problems Acute kidney injury - prior creatinine 1.09 but this was 5 years ago in 2020.  Creatinine during this admission was 1.76.  Unknown whether she has a degree of chronic kidney disease.  Creatinine now back to baseline with fluids, encourage p.o. intake.  Essential hypertension  -ramipril was on hold due to acute kidney injury.  Resume now as her kidney function has improved, she is normotensive  Hyperkalemia - in the setting of DKA.  Discontinue Lokelma  GAD - continue home medications  PT evaluation pending  Scheduled Meds:  aspirin EC  81 mg Oral Daily   busPIRone  30 mg Oral BID   Chlorhexidine Gluconate Cloth  6 each Topical Daily   feeding supplement  237 mL Oral BID BM   influenza vac split trivalent PF  0.5 mL Intramuscular Tomorrow-1000   insulin aspart  0-5 Units Subcutaneous QHS   insulin aspart  0-9 Units Subcutaneous TID WC   insulin aspart  4 Units Subcutaneous TID WC   insulin glargine-yfgn  25 Units Subcutaneous Daily   pneumococcal 20-valent conjugate vaccine  0.5 mL Intramuscular Tomorrow-1000   potassium & sodium phosphates  1 packet Oral TID WC & HS   ramipril  5 mg Oral Daily   Continuous Infusions:  lactated ringers 75 mL/hr at 07/14/23 2233   PRN Meds:.ALPRAZolam, dextrose, mouth rinse  Current Outpatient Medications  Medication Instructions   ALPRAZolam (XANAX) 0.5 mg, Oral, 3 times daily PRN   aspirin EC 81 mg, Daily   busPIRone (BUSPAR) 30 mg, Oral, 2 times daily   celecoxib (CELEBREX) 200 mg, Daily PRN   desvenlafaxine (PRISTIQ) 200 mg, Oral, Daily   diphenhydrAMINE (BENADRYL) 25 mg, Every 8 hours PRN   fluticasone (FLONASE) 50 MCG/ACT nasal spray 2 sprays, 2 times daily PRN   Insulin Glargine (BASAGLAR KWIKPEN) 100 UNIT/ML SOPN INJ  30 UNITS [place] QHS UTD   ramipril (ALTACE) 5 mg, Daily at bedtime    Diet Orders (From admission, onward)     Start     Ordered   07/15/23 0754  DIET SOFT Room service appropriate? Yes; Fluid consistency: Thin  Diet effective now       Question Answer Comment  Room service appropriate? Yes   Fluid consistency: Thin      07/15/23 0753            DVT prophylaxis: SCDs Start: 07/13/23 2203   Lab Results  Component Value Date   PLT 288 07/15/2023     Code Status: Full  Code  Family Communication: No family at bedside  Status is: Inpatient   Level of care: Med-Surg  Consultants:  none  Objective: Vitals:   07/14/23 1400 07/14/23 1506 07/14/23 2115 07/15/23 0512  BP: 107/67 116/62 117/69 121/65  Pulse: 86 79 81 74  Resp: 14 16 16 18   Temp:  98 F (36.7 C) 98.4 F (36.9 C) 98.3 F (36.8 C)  TempSrc:  Oral Oral Oral  SpO2: 100% 100% 100% 100%  Weight:      Height:        Intake/Output Summary (Last 24 hours) at 07/15/2023 0939 Last data filed at 07/15/2023 0545 Gross per 24 hour  Intake 319.57 ml  Output 100 ml  Net 219.57 ml   Wt Readings from Last 3 Encounters:  07/13/23 77.6 kg  02/28/19 94 kg  01/10/19 85.7 kg    Examination:  Constitutional: NAD Eyes: lids and conjunctivae normal, no scleral icterus ENMT: mmm Neck: normal, supple Respiratory: clear to auscultation bilaterally, no wheezing, no crackles.  Cardiovascular: Regular rate and rhythm, no murmurs / rubs / gallops. No LE edema. Abdomen: soft, no distention, no tenderness. Bowel sounds positive.    Data Reviewed: I have independently reviewed following labs and imaging studies   CBC Recent Labs  Lab 07/13/23 1630 07/13/23 1639 07/14/23 0311 07/15/23 0451  WBC 10.4  --  12.6* 9.7  HGB 17.7* 18.0* 16.7* 13.8  HCT 49.6* 53.0* 46.7* 38.5  PLT 385  --  316 288  MCV 85.4  --  85.1 85.6  MCH 30.5  --  30.4 30.7  MCHC 35.7  --  35.8 35.8  RDW 11.8  --  11.5 11.9  LYMPHSABS  --   --  3.4  --   MONOABS  --   --  1.6*  --   EOSABS  --   --  0.0  --   BASOSABS  --   --  0.0  --     Recent Labs  Lab 07/13/23 1630 07/13/23 1639 07/13/23 2105 07/14/23 0009 07/14/23 0311 07/14/23 0601 07/14/23 0952 07/15/23 0451  NA 121*   < > 132* 131* 130* 132* 132* 128*  K 6.3*   < > 5.6* 4.2 4.6 3.9 4.0 3.7  CL 85*   < > 96* 96* 97* 99 99 96*  CO2 10*  --  10* 17* 17* 19* 20* 17*  GLUCOSE 833*   < > 349* 193* 229* 149* 166* 265*  BUN 48*   < > 47* 47* 46* 41* 38*  22*  CREATININE 1.76*   < > 1.57* 1.36* 1.34* 1.17* 1.06* 0.92  CALCIUM 10.1  --  10.4* 10.5* 9.7 9.9 9.5 8.7*  AST 12*  --   --   --   --   --   --  16  ALT 15  --   --   --   --   --   --  13  ALKPHOS 81  --   --   --   --   --   --  60  BILITOT 1.7*  --   --   --   --   --   --  1.2  ALBUMIN 3.8  --   --   --   --   --   --  2.8*  MG 2.4  --  2.4  --  2.1  --   --  2.0  PROCALCITON  --   --  0.34  --   --   --   --   --   TSH  --   --   --  1.711  --   --   --   --    < > = values in this interval not displayed.    ------------------------------------------------------------------------------------------------------------------ No results for input(s): "CHOL", "HDL", "LDLCALC", "TRIG", "CHOLHDL", "LDLDIRECT" in the last 72 hours.  No results found for: "HGBA1C" ------------------------------------------------------------------------------------------------------------------ Recent Labs    07/14/23 0009  TSH 1.711    Cardiac Enzymes No results for input(s): "CKMB", "TROPONINI", "MYOGLOBIN" in the last 168 hours.  Invalid input(s): "CK" ------------------------------------------------------------------------------------------------------------------    Component Value Date/Time   BNP 15.0 08/12/2016 1248    CBG: Recent Labs  Lab 07/14/23 1012 07/14/23 1210 07/14/23 1718 07/14/23 1959 07/15/23 0725  GLUCAP 167* 166* 297* 346* 298*    Recent Results (from the past 240 hours)  Resp panel by RT-PCR (RSV, Flu A&B, Covid) Anterior Nasal Swab     Status: None   Collection Time: 07/13/23  8:17 PM   Specimen: Anterior Nasal Swab  Result Value Ref Range Status   SARS Coronavirus 2 by RT PCR NEGATIVE NEGATIVE Final    Comment: (NOTE) SARS-CoV-2 target nucleic acids are NOT DETECTED.  The SARS-CoV-2 RNA is generally detectable in upper respiratory specimens during the acute phase of infection. The lowest concentration of SARS-CoV-2 viral copies this assay can detect  is 138 copies/mL. A negative result does not preclude SARS-Cov-2 infection and should not be used as the sole basis for treatment or other patient management decisions. A negative result may occur with  improper specimen collection/handling, submission of specimen other than nasopharyngeal swab, presence of viral mutation(s) within the areas targeted by this assay, and inadequate number of viral copies(<138 copies/mL). A negative result must be combined with clinical observations, patient history, and epidemiological information. The expected result is Negative.  Fact Sheet for Patients:  BloggerCourse.com  Fact Sheet for Healthcare Providers:  SeriousBroker.it  This test is no t yet approved or cleared by the [place] FDA and  has been authorized for detection and/or diagnosis of SARS-CoV-2 by FDA under an Emergency Use Authorization (EUA). This EUA will remain  in effect (meaning this test can be used) for the duration of the COVID-19 declaration under Section 564(b)(1) of the Act, 21 U.S.C.section 360bbb-3(b)(1), unless the authorization is terminated  or revoked sooner.       Influenza A by PCR NEGATIVE NEGATIVE Final   Influenza B by PCR NEGATIVE NEGATIVE Final    Comment: (NOTE) The Xpert Xpress SARS-CoV-2/FLU/RSV plus assay is intended as an aid in the diagnosis of influenza from Nasopharyngeal swab specimens and should not be used as a sole basis for treatment. Nasal washings and aspirates are unacceptable for Xpert Xpress SARS-CoV-2/FLU/RSV testing.  Fact Sheet for Patients: BloggerCourse.com  Fact Sheet for Healthcare Providers: SeriousBroker.it  This test is not yet approved or cleared by the  [place]  FDA and has been authorized for detection and/or diagnosis of SARS-CoV-2 by FDA under an Emergency Use Authorization (EUA). This EUA will remain in effect  (meaning this test can be used) for the duration of the COVID-19 declaration under Section 564(b)(1) of the Act, 21 U.S.C. section 360bbb-3(b)(1), unless the authorization is terminated or revoked.     Resp Syncytial Virus by PCR NEGATIVE NEGATIVE Final    Comment: (NOTE) Fact Sheet for Patients: BloggerCourse.com  Fact Sheet for Healthcare Providers: SeriousBroker.it  This test is not yet approved or cleared by the [place]  FDA and has been authorized for detection and/or diagnosis of SARS-CoV-2 by FDA under an Emergency Use Authorization (EUA). This EUA will remain in effect (meaning this test can be used) for the duration of the COVID-19 declaration under Section 564(b)(1) of the Act, 21 U.S.C. section 360bbb-3(b)(1), unless the authorization is terminated or revoked.  Performed at [facility], [address] [place], [place] [address]   MRSA Next Gen by PCR, Nasal     Status: None   Collection Time: 07/13/23  9:14 PM   Specimen: Nasal Mucosa; Nasal Swab  Result Value Ref Range Status   MRSA by PCR Next Gen NOT DETECTED NOT DETECTED Final    Comment: (NOTE) The GeneXpert MRSA Assay (FDA approved for NASAL specimens only), is one component of a comprehensive MRSA colonization surveillance program. It is not intended to diagnose MRSA infection nor to guide or monitor treatment for MRSA infections. Test performance is not FDA approved in patients less than 33 years old. Performed at [facility], [address] [place], [place] [address]      Radiology Studies: No results found.    Kathlen Para, MD, PhD [facility]  Between 7 am - 7 pm I am available, please contact me via Amion (for emergencies) or Securechat (non urgent messages)  Between 7 pm - 7 am I am not available, please contact night coverage MD/APP via Amion

## 2023-07-15 NOTE — TOC Progression Note (Signed)
 Transition of Care [facility]) - Progression Note    Patient Details  Name: Mary Barrera MRN: [id number] Date of Birth: December 08, 1965  [facility]) CM/SW Contact  Kathryn Parish, RN Phone Number: 07/15/2023, 4:30 PM  Clinical Narrative:    NCM entered patients room on two separate occasions and informed that she was resting. Patient did give NCM permission on 2nd visit to sent information in HUB. 1st time at 11:45 AM 2nd time at 4:25 PM        Expected Discharge Plan and Services                                               Social Determinants of Health (SDOH) Interventions SDOH Screenings   Food Insecurity: Patient Unable To Answer (07/14/2023)  Housing: Patient Unable To Answer (07/14/2023)  Transportation Needs: Patient Unable To Answer (07/14/2023)  Utilities: Patient Unable To Answer (07/14/2023)  Depression (PHQ2-9): Low Risk  (07/04/2018)  Financial Resource Strain: Low Risk  (07/04/2018)  Physical Activity: Insufficiently Active (07/04/2018)  Social Connections: Moderately Isolated (07/14/2023)  Stress: Stress Concern Present (07/04/2018)  Tobacco Use: Low Risk  (07/14/2023)  Recent Concern: Tobacco Use - Medium Risk (06/24/2023)   Received from [facility]    Readmission Risk Interventions     No data to display

## 2023-07-16 DIAGNOSIS — E1110 Type 2 diabetes mellitus with ketoacidosis without coma: Secondary | ICD-10-CM | POA: Diagnosis not present

## 2023-07-16 LAB — GLUCOSE, CAPILLARY
Glucose-Capillary: 233 mg/dL — ABNORMAL HIGH (ref 70–99)
Glucose-Capillary: 307 mg/dL — ABNORMAL HIGH (ref 70–99)
Glucose-Capillary: 168 mg/dL — ABNORMAL HIGH (ref 70–99)
Glucose-Capillary: 271 mg/dL — ABNORMAL HIGH (ref 70–99)

## 2023-07-16 MED ORDER — INSULIN ASPART 100 UNIT/ML IJ SOLN
8.0000 [IU] | Freq: Three times a day (TID) | INTRAMUSCULAR | Status: DC
  Administered 2023-07-17 – 2023-07-20 (×10): 8 [IU] via SUBCUTANEOUS

## 2023-07-16 MED ORDER — INSULIN GLARGINE-YFGN 100 UNIT/ML ~~LOC~~ SOLN
28.0000 [IU] | Freq: Every day | SUBCUTANEOUS | Status: DC
  Administered 2023-07-17: 28 [IU] via SUBCUTANEOUS
  Filled 2023-07-16: qty 0.28

## 2023-07-16 NOTE — Inpatient Diabetes Management (Signed)
 Inpatient Diabetes Program Recommendations  AACE/ADA: New Consensus Statement on Inpatient Glycemic Control (2015)  Target Ranges:  Prepandial:   less than 140 mg/dL      Peak postprandial:   less than 180 mg/dL (1-2 hours)      Critically ill patien

## 2023-07-16 NOTE — Plan of Care (Signed)

## 2023-07-16 NOTE — TOC PASRR Note (Signed)
 30 Day PASRR Note   Patient Details  Name: Mary Barrera Date of Birth: 06/08/1965   [facility]) CM/SW Contact:    Kathryn Parish, RN Phone Number: 07/16/2023, 9:11 AM  To Whom It May Concern:  Please be advised that this patient will require a short-term nursing home stay - anticipated 30 days or less for rehabilitation and strengthening.   The plan is for return home.

## 2023-07-16 NOTE — Progress Notes (Signed)
 PROGRESS NOTE    Mary Barrera  UEA:[id number]  DOB: 1965-09-01  DOA: 07/13/2023 PCP: Olin Bertin, MD Outpatient Specialists:   Hospital course:  58 year old female with DM2, GAD, chronic lower back pain was admitted with DKA due to not taking her insulin  complicated by AKI.  She was treated with IV insulin  and fluids with good effect.  Hemoglobin A1c was 14 and patient was seen by the diabetes coordinator.  Patient is now awaiting disposition to SNF.  Subjective:  Patient states that she feels okay.  However states she does not know what a good blood sugar number should be.  Is also not sure what diet she should be on.  States she would like to get some diabetes education   Objective: Vitals:   07/15/23 1231 07/15/23 1947 07/16/23 0554 07/16/23 0756  BP: (!) 147/73 126/65 (!) 143/83 128/70  Pulse: 90 90 81 79  Resp: 17 16 16 16   Temp: (!) 97.4 F (36.3 C) 98.3 F (36.8 C) 98.7 F (37.1 C) 98.6 F (37 C)  TempSrc: Oral Oral  Oral  SpO2: 100% 100% 100% 100%  Weight:      Height:       No intake or output data in the 24 hours ending 07/16/23 1543 Filed Weights   07/13/23 1601 07/13/23 2005  Weight: 81.2 kg 77.6 kg     Exam:  General: Reasonably well-appearing female sitting in recliner getting ready to eat lunch Eyes: sclera anicteric, conjuctiva mild injection bilaterally CVS: S1-S2, regular  Respiratory:  decreased air entry bilaterally secondary to decreased inspiratory effort, rales at bases  GI: NABS, soft, NT  LE: Warm and well-perfused Neuro: A/O x 3,  grossly nonfocal.   Data Reviewed:  Basic Metabolic Panel: Recent Labs  Lab 07/13/23 1630 07/13/23 1639 07/13/23 2105 07/13/23 2115 07/14/23 0009 07/14/23 0311 07/14/23 0601 07/14/23 0952 07/15/23 0451  NA 121*   < > 132*  --  131* 130* 132* 132* 128*  K 6.3*   < > 5.6*  --  4.2 4.6 3.9 4.0 3.7  CL 85*   < > 96*  --  96* 97* 99 99 96*  CO2 10*  --  10*  --  17* 17* 19* 20* 17*  GLUCOSE  833*   < > 349*  --  193* 229* 149* 166* 265*  BUN 48*   < > 47*  --  47* 46* 41* 38* 22*  CREATININE 1.76*   < > 1.57*  --  1.36* 1.34* 1.17* 1.06* 0.92  CALCIUM  10.1  --  10.4*  --  10.5* 9.7 9.9 9.5 8.7*  MG 2.4  --  2.4  --   --  2.1  --   --  2.0  PHOS  --   --   --  3.7  --  2.8  --   --  1.5*   < > = values in this interval not displayed.    CBC: Recent Labs  Lab 07/13/23 1630 07/13/23 1639 07/14/23 0311 07/15/23 0451  WBC 10.4  --  12.6* 9.7  NEUTROABS  --   --  7.5  --   HGB 17.7* 18.0* 16.7* 13.8  HCT 49.6* 53.0* 46.7* 38.5  MCV 85.4  --  85.1 85.6  PLT 385  --  316 288     Scheduled Meds:  aspirin  EC  81 mg Oral Daily   busPIRone   30 mg Oral BID   feeding supplement  237 mL Oral BID  BM   influenza vac split trivalent PF  0.5 mL Intramuscular Tomorrow-1000   insulin  aspart  0-5 Units Subcutaneous QHS   insulin  aspart  0-9 Units Subcutaneous TID WC   insulin  aspart  6 Units Subcutaneous TID WC   insulin  glargine-yfgn  25 Units Subcutaneous Daily   ramipril   5 mg Oral Daily   Continuous Infusions:   Assessment & Plan:   DKA DM 2, uncontrolled DKA is resolved Appreciate diabetes coordinator evaluation and education Increase glargine to 28 units, regular to 8 units 3 times daily AC  Mild hyponatremia Asymptomatic Likely secondary to Pristiq , will follow  Hypophosphatemia Has been repleted, will recheck tomorrow  HTN At goal on ramipril   GAD Continue buspirone  and Pristiq   Disposition Awaiting availability of SNF bed per PT recommendations  AKI--resolved   DVT prophylaxis: SCD Code Status: Full Family Communication: None today     Studies: No results found.  Principal Problem:   DKA, type 2 (HCC) Active Problems:   Hypertension, essential   Generalized anxiety disorder   Hyponatremia   AKI (acute kidney injury) (HCC)   Hyperkalemia   Elevated troponin   DKA (diabetic ketoacidosis) (HCC)     Blaise Palladino Rollene Clink, [facility]  If 7PM-7AM, please contact night-coverage [email or web address]   LOS: 2 days

## 2023-07-16 NOTE — TOC Initial Note (Addendum)
 Transition of Care [facility]) - Initial/Assessment Note    Patient Details  Name: Mary Barrera MRN: [id number] Date of Birth: 08-22-1965  Transition of Care [facility]) CM/SW Contact:    Mary Parish, RN Phone Number: 07/16/2023, 11:25 AM  Clinical Narrative:                 Presented for fall due to DKA. Patient states she lives in a apartment with son Mary Barrera [phone]);PIRJJ self to appointments;PCP and insurance verified; No DME, HH, or oxygen;At discharge son will transport home via private vehicle. TOC will follow progression to discharge. PASRR requested a 30 Day Note; waiting on MD signature, FL2 completed.  2:58 PM PASRR 30 day note uploaded to [place] [facility]. 3:05 PM Bed request sent in HUB.      Patient Goals and CMS Choice            Expected Discharge Plan and Services                                              Prior Living Arrangements/Services                       Activities of Daily Living   ADL Screening (condition at time of admission) Independently performs ADLs?: Yes (appropriate for developmental age) Is the patient deaf or have difficulty hearing?: No Does the patient have difficulty seeing, even when wearing glasses/contacts?: No Does the patient have difficulty concentrating, remembering, or making decisions?: No  Permission Sought/Granted                  Emotional Assessment              Admission diagnosis:  Hyperkalemia [E87.5] Hyponatremia [E87.1] Generalized weakness [R53.1] AKI (acute kidney injury) (HCC) [N17.9] Noncompliance with medications [Z91.148] DKA, type 2 (HCC) [E11.10] Fall, initial encounter [W19.XXXA] Blunt head trauma, initial encounter [S09.8XXA] Diabetic ketoacidosis without coma associated with type 2 diabetes mellitus (HCC) [E11.10] Type 2 diabetes mellitus with other specified complication, with long-term current use of insulin  (HCC) [E11.69, Z79.4] DKA (diabetic ketoacidosis)  (HCC) [E11.10] Patient Active Problem List   Diagnosis Date Noted   Elevated troponin 07/14/2023   DKA (diabetic ketoacidosis) (HCC) 07/14/2023   DKA, type 2 (HCC) 07/13/2023   Hyponatremia 07/13/2023   AKI (acute kidney injury) (HCC) 07/13/2023   Hyperkalemia 07/13/2023   Major depressive disorder, recurrent episode, moderate (HCC) 03/05/2020   Generalized anxiety disorder 03/05/2020   PTSD (post-traumatic stress disorder) 03/05/2020   Nightmares 03/05/2020   Shortness of breath 08/19/2016   Hypertension, essential 08/19/2016   PCP:  Mary Bertin, MD Pharmacy:   [id number] - [place], [place] - [address] [place] [address] Phone: [phone] Fax: [phone]  [id number] - [address] [place] - [address] [place] [address] Phone: [phone] Fax: [phone]  [facility] [address] [place] - [address] [place] [address] Phone: [phone] Fax: [phone]     Social Drivers of Health (SDOH) Social History: SDOH Screenings   Food Insecurity: Patient Unable To Answer (07/14/2023)  Housing: Patient Unable To Answer (07/14/2023)  Transportation Needs: Patient Unable To Answer (07/14/2023)  Utilities: Patient Unable To Answer (07/14/2023)  Depression (  PHQ2-9): Low Risk  (07/04/2018)  Financial Resource Strain: Low Risk  (07/04/2018)  Physical Activity: Insufficiently Active (07/04/2018)  Social Connections: Moderately Isolated (07/14/2023)  Stress: Stress Concern Present (07/04/2018)  Tobacco Use: Low Risk  (07/14/2023)  Recent Concern: Tobacco Use - Medium Risk (06/24/2023)   Received from [facility]   SDOH Interventions:     Readmission Risk Interventions     No data to display

## 2023-07-16 NOTE — NC FL2 (Signed)
 [place]  MEDICAID [id number] LEVEL OF CARE FORM     IDENTIFICATION  Patient Name: Mary Barrera Birthdate: 10-05-1965 Sex: female Admission Date (Current Location): 07/13/2023  [employer] and [place] Number:  Producer, television/film/video and Address:  [facility],  [address] [place], [place] [address]      Provider Number: [id number]  Attending Physician Name and Address:  Donley Furth*  Relative Name and Phone Number:  Linder Revere)  [phone]    Current Level of Care: Hospital Recommended Level of Care: Skilled Nursing Facility Prior Approval Number:    Date Approved/Denied:   PASRR Number:    Discharge Plan: SNF    Current Diagnoses: Patient Active Problem List   Diagnosis Date Noted   Elevated troponin 07/14/2023   DKA (diabetic ketoacidosis) (HCC) 07/14/2023   DKA, type 2 (HCC) 07/13/2023   Hyponatremia 07/13/2023   AKI (acute kidney injury) (HCC) 07/13/2023   Hyperkalemia 07/13/2023   Major depressive disorder, recurrent episode, moderate (HCC) 03/05/2020   Generalized anxiety disorder 03/05/2020   PTSD (post-traumatic stress disorder) 03/05/2020   Nightmares 03/05/2020   Shortness of breath 08/19/2016   Hypertension, essential 08/19/2016    Orientation RESPIRATION BLADDER Height & Weight     Self, Time, Situation, Place  Normal Continent Weight: 77.6 kg Height:  5\' 6"  (167.6 cm)  BEHAVIORAL SYMPTOMS/MOOD NEUROLOGICAL BOWEL NUTRITION STATUS      Continent Diet (see discharge summary)  AMBULATORY STATUS COMMUNICATION OF NEEDS Skin   Limited Assist Verbally Normal                       Personal Care Assistance Level of Assistance              Functional Limitations Info             SPECIAL CARE FACTORS FREQUENCY  PT (By licensed PT), OT (By licensed OT)     PT Frequency: 5X weekly OT Frequency: 5X weekly            Contractures Contractures Info: Not present    Additional Factors Info  Allergies, Code Status,  Psychotropic, Insulin  Sliding Scale Code Status Info: FULL Allergies Info: Desvenlafaxine , Humalog (Insulin  Lispro), Amoxicillin, Atorvastatin, Hydrocodone-acetaminophen , Oxycodone, Simvastatin, Tramadol, Tramadol Hcl Psychotropic Info: ALPRAZolam  (XANAX ) ,busPIRone  (BUSPAR ),desvenlafaxine  (PRISTIQ ), Insulin  Sliding Scale Info: insulin  aspart (novoLOG ) injection 0-9 Units 0-9 Units, Subcutaneous, 3 times daily with meals, First dose on Wed 07/14/23 at 1200  Correction coverage: Sensitive (thin, NPO, renal)  CBG < 70: Implement Hypoglycemia Standing Orders and refer to Hypoglycemia Standing Orders sidebar report  CBG 70 - 120: 0 units  CBG 121 - 150: 1 unit  CBG 151 - 200: 2 units  CBG 201 - 250: 3 units  CBG 251 - 300: 5 units  CBG 301 - 350: 7 units  CBG 351 - 400: 9 units       Current Medications (07/16/2023):  This is the current hospital active medication list Current Facility-Administered Medications  Medication Dose Route Frequency Provider Last Rate Last Admin   ALPRAZolam  (XANAX ) tablet 0.5 mg  0.5 mg Oral TID PRN Gherghe, Costin M, MD   0.5 mg at 07/14/23 2011   aspirin  EC tablet 81 mg  81 mg Oral Daily Gherghe, Costin M, MD   81 mg at 07/16/23 7846   busPIRone  (BUSPAR ) tablet 30 mg  30 mg Oral BID Gherghe, Costin M, MD   30 mg at 07/16/23 0906   dextrose  50 % solution  0-50 mL  0-50 mL Intravenous PRN Doutova, Anastassia, MD       feeding supplement (ENSURE ENLIVE / ENSURE PLUS) liquid 237 mL  237 mL Oral BID BM Gherghe, Costin M, MD   237 mL at 07/16/23 0907   influenza vac split trivalent PF (FLULAVAL) injection 0.5 mL  0.5 mL Intramuscular Tomorrow-1000 Gherghe, Costin M, MD       insulin  aspart (novoLOG ) injection 0-5 Units  0-5 Units Subcutaneous QHS Gherghe, Costin M, MD   2 Units at 07/15/23 2102   insulin  aspart (novoLOG ) injection 0-9 Units  0-9 Units Subcutaneous TID WC Gherghe, Costin M, MD   3 Units at 07/16/23 1308   insulin  aspart (novoLOG ) injection 6 Units  6 Units  Subcutaneous TID WC Gherghe, Costin M, MD   6 Units at 07/16/23 6578   insulin  glargine-yfgn (SEMGLEE ) injection 25 Units  25 Units Subcutaneous Daily Gherghe, Costin M, MD   25 Units at 07/16/23 0907   Oral care mouth rinse  15 mL Mouth Rinse PRN Doutova, Anastassia, MD       ramipril  (ALTACE ) capsule 5 mg  5 mg Oral Daily Gherghe, Costin M, MD   5 mg at 07/16/23 4696     Discharge Medications: Please see discharge summary for a list of discharge medications.  Relevant Imaging Results:  Relevant Lab Results:   Additional Information SS# [id number]  Kathryn Parish, RN

## 2023-07-17 DIAGNOSIS — E1110 Type 2 diabetes mellitus with ketoacidosis without coma: Secondary | ICD-10-CM | POA: Diagnosis not present

## 2023-07-17 LAB — BASIC METABOLIC PANEL WITH GFR
Anion gap: 9 (ref 5–15)
BUN: 12 mg/dL (ref 6–20)
CO2: 24 mmol/L (ref 22–32)
Calcium: 8.5 mg/dL — ABNORMAL LOW (ref 8.9–10.3)
Chloride: 102 mmol/L (ref 98–111)
Creatinine, Ser: 1 mg/dL (ref 0.44–1.00)
GFR, Estimated: >60 mL/min (ref >60)
Glucose, Bld: 243 mg/dL — ABNORMAL HIGH (ref 70–99)
Potassium: 3.2 mmol/L — ABNORMAL LOW (ref 3.5–5.1)
Sodium: 135 mmol/L (ref 135–145)

## 2023-07-17 LAB — GLUCOSE, CAPILLARY
Glucose-Capillary: 317 mg/dL — ABNORMAL HIGH (ref 70–99)
Glucose-Capillary: 212 mg/dL — ABNORMAL HIGH (ref 70–99)
Glucose-Capillary: 148 mg/dL — ABNORMAL HIGH (ref 70–99)
Glucose-Capillary: 130 mg/dL — ABNORMAL HIGH (ref 70–99)

## 2023-07-17 LAB — MAGNESIUM: Magnesium: 2 mg/dL (ref 1.7–2.4)

## 2023-07-17 LAB — PHOSPHORUS: Phosphorus: 2.9 mg/dL (ref 2.5–4.6)

## 2023-07-17 MED ORDER — INSULIN GLARGINE-YFGN 100 UNIT/ML ~~LOC~~ SOLN
30.0000 [IU] | Freq: Every day | SUBCUTANEOUS | Status: DC
  Administered 2023-07-18 – 2023-07-20 (×3): 30 [IU] via SUBCUTANEOUS
  Filled 2023-07-17 (×3): qty 0.3

## 2023-07-17 NOTE — Progress Notes (Addendum)
 Physical Therapy Treatment Patient Details Name: Mary Barrera MRN: [id number] DOB: [birth year] Today's Date: 07/17/2023   History of Present Illness 58 year old female admitted with confusion, repeated falls and overall not feeling well for the past 1 week, including nausea, vomiting, poor p.o. intake.  She is supposed take insulin  at home however she stopped using that because she was not been feeling well.  She has been off her insulin  for several weeks. Dx of DKA. PMH: DM2, anxiety, chronic back pain    PT Comments  Pt agreeable to working with therapy. Pt is progressing well with mobility. Overall, Min A on today. She walked ~75 feet with a RW. Pt tolerated session well. Will continue to follow and progress activity as tolerated. If pt continues to progress well, she could potentially d/c home with family assistance. Recommend daily hallway ambulation with nursing and/or mobility team.    If plan is discharge home, recommend the following: A little help with walking and/or transfers;A little help with bathing/dressing/bathroom;Assistance with cooking/housework;Assist for transportation;Help with stairs or ramp for entrance   Can travel by private vehicle        Equipment Recommendations  Rolling walker (2 wheels)    Recommendations for Other Services       Precautions / Restrictions Precautions Precautions: Fall Restrictions Weight Bearing Restrictions Per Provider Order: No     Mobility  Bed Mobility Overal bed mobility: Needs Assistance Bed Mobility: Supine to Sit     Supine to sit: Supervision          Transfers Overall transfer level: Needs assistance Equipment used: Rolling walker (2 wheels) Transfers: Sit to/from Stand Sit to Stand: Min assist           General transfer comment: small amout of assist. cues for safety, hand placement    Ambulation/Gait Ambulation/Gait assistance: Min assist, Contact guard assist Gait Distance (Feet): 75 Feet Assistive  device: Rolling walker (2 wheels) Gait Pattern/deviations: Step-through pattern, Decreased stride length       General Gait Details: Fair gait speed. Mild unsteadiness intermittently but no overt LOB with RW use. Tolerated distance well.   Stairs             Wheelchair Mobility     Tilt Bed    Modified Rankin (Stroke Patients Only)       Balance Overall balance assessment: Needs assistance         Standing balance support: Bilateral upper extremity supported, During functional activity, Reliant on assistive device for balance Standing balance-Leahy Scale: Poor                              Communication Communication Communication: No apparent difficulties  Cognition Arousal: Alert Behavior During Therapy: WFL for tasks assessed/performed   PT - Cognitive impairments: Safety/Judgement, Problem solving                       PT - Cognition Comments: improved cognition but still requires cues for tasks and safety        Cueing Cueing Techniques: Verbal cues  Exercises      General Comments        Pertinent Vitals/Pain Pain Assessment Pain Assessment: No/denies pain    Home Living                          Prior Function  PT Goals (current goals can now be found in the care plan section) Progress towards PT goals: Progressing toward goals    Frequency    Min 3X/week      PT Plan      Co-evaluation              AM-PAC PT "6 Clicks" Mobility   Outcome Measure  Help needed turning from your back to your side while in a flat bed without using bedrails?: A Little Help needed moving from lying on your back to sitting on the side of a flat bed without using bedrails?: A Little Help needed moving to and from a bed to a chair (including a wheelchair)?: A Little Help needed standing up from a chair using your arms (e.g., wheelchair or bedside chair)?: A Little Help needed to walk in hospital  room?: A Little Help needed climbing 3-5 steps with a railing? : A Little 6 Click Score: 18    End of Session Equipment Utilized During Treatment: Gait belt Activity Tolerance: Patient tolerated treatment well Patient left: in bed;with call bell/phone within reach   PT Visit Diagnosis: Unsteadiness on feet (R26.81);History of falling (Z91.81);Repeated falls (R29.6);Other abnormalities of gait and mobility (R26.89);Muscle weakness (generalized) (M62.81);Difficulty in walking, not elsewhere classified (R26.2)     Time: 1610-9604 PT Time Calculation (min) (ACUTE ONLY): 8 min  Charges:    $Gait Training: 8-22 mins PT General Charges $$ ACUTE PT VISIT: 1 Visit              Tanda Falter, PT Acute Rehabilitation  Office: [phone]

## 2023-07-17 NOTE — Plan of Care (Signed)

## 2023-07-17 NOTE — Progress Notes (Signed)
 PROGRESS NOTE    Mary Barrera  ZOX:[id number]  DOB: 1966/03/22  DOA: 07/13/2023 PCP: Mary Bertin, MD Outpatient Specialists:   Hospital course:  58 year old female with DM2, GAD, chronic lower back pain was admitted with DKA due to not taking her insulin  complicated by AKI.  She was treated with IV insulin  and fluids with good effect.  Hemoglobin A1c was 14 and patient was seen by the diabetes coordinator.  Patient is now awaiting disposition to SNF.  Subjective:  Patient without new complaints, feels well, is content to stay until SNF bed can be found for rehab.  He is looking forward to meeting with diabetes coordinator  Objective: Vitals:   07/16/23 2025 07/17/23 0622 07/17/23 0946 07/17/23 1218  BP: 127/68 (!) 126/94 (!) 119/59 116/65  Pulse: (!) 101 77  78  Resp: 18 18    Temp: 98.8 F (37.1 C) 99.5 F (37.5 C)  98.4 F (36.9 C)  TempSrc: Oral   Oral  SpO2: 100% 100%  100%  Weight:      Height:        Intake/Output Summary (Last 24 hours) at 07/17/2023 1632 Last data filed at 07/16/2023 1925 Gross per 24 hour  Intake 200 ml  Output --  Net 200 ml   Filed Weights   07/13/23 1601 07/13/23 2005  Weight: 81.2 kg 77.6 kg     Exam:  General: Well-appearing female lying flat in bed chatting with her nurse Eyes: sclera anicteric, conjuctiva mild injection bilaterally CVS: S1-S2, regular  Respiratory:  decreased air entry bilaterally secondary to decreased inspiratory effort, rales at bases  GI: NABS, soft, NT  LE: Warm and well-perfused Neuro: A/O x 3,  grossly nonfocal.   Data Reviewed:  Basic Metabolic Panel: Recent Labs  Lab 07/13/23 1630 07/13/23 1639 07/13/23 2105 07/13/23 2115 07/14/23 0009 07/14/23 0311 07/14/23 0601 07/14/23 0952 07/15/23 0451 07/17/23 0447  NA 121*   < > 132*  --    < > 130* 132* 132* 128* 135  K 6.3*   < > 5.6*  --    < > 4.6 3.9 4.0 3.7 3.2*  CL 85*   < > 96*  --    < > 97* 99 99 96* 102  CO2 10*  --  10*  --     < > 17* 19* 20* 17* 24  GLUCOSE 833*   < > 349*  --    < > 229* 149* 166* 265* 243*  BUN 48*   < > 47*  --    < > 46* 41* 38* 22* 12  CREATININE 1.76*   < > 1.57*  --    < > 1.34* 1.17* 1.06* 0.92 1.00  CALCIUM  10.1  --  10.4*  --    < > 9.7 9.9 9.5 8.7* 8.5*  MG 2.4  --  2.4  --   --  2.1  --   --  2.0 2.0  PHOS  --   --   --  3.7  --  2.8  --   --  1.5* 2.9   < > = values in this interval not displayed.    CBC: Recent Labs  Lab 07/13/23 1630 07/13/23 1639 07/14/23 0311 07/15/23 0451  WBC 10.4  --  12.6* 9.7  NEUTROABS  --   --  7.5  --   HGB 17.7* 18.0* 16.7* 13.8  HCT 49.6* 53.0* 46.7* 38.5  MCV 85.4  --  85.1 85.6  PLT  385  --  316 288     Scheduled Meds:  aspirin  EC  81 mg Oral Daily   busPIRone   30 mg Oral BID   feeding supplement  237 mL Oral BID BM   influenza vac split trivalent PF  0.5 mL Intramuscular Tomorrow-1000   insulin  aspart  0-5 Units Subcutaneous QHS   insulin  aspart  0-9 Units Subcutaneous TID WC   insulin  aspart  8 Units Subcutaneous TID WC   insulin  glargine-yfgn  28 Units Subcutaneous Daily   ramipril   5 mg Oral Daily   Continuous Infusions:   Assessment & Plan:   DKA DM 2, uncontrolled DKA is resolved Sugars are somewhat better with increase of glargine although they are elevated in the morning. Will increase glargine from 28 to 30 units Continue aspart 8 units 3 times daily AC Appreciate diabetes coordinator involvement  Mild hyponatremia Asymptomatic Likely secondary to Pristiq , will follow  Hypophosphatemia Has been repleted, will recheck tomorrow  HTN At goal on ramipril   GAD Continue buspirone  and Pristiq   Disposition Awaiting availability of SNF bed per PT recommendations  AKI--resolved   DVT prophylaxis: SCD Code Status: Full Family Communication: None today     Studies: No results found.  Principal Problem:   DKA, type 2 (HCC) Active Problems:   Hypertension, essential   Generalized anxiety disorder    Hyponatremia   AKI (acute kidney injury) (HCC)   Hyperkalemia   Elevated troponin   DKA (diabetic ketoacidosis) (HCC)     Mary Barrera, [facility]  If 7PM-7AM, please contact night-coverage [email or web address]   LOS: 3 days

## 2023-07-18 DIAGNOSIS — E1110 Type 2 diabetes mellitus with ketoacidosis without coma: Secondary | ICD-10-CM | POA: Diagnosis not present

## 2023-07-18 LAB — GLUCOSE, CAPILLARY
Glucose-Capillary: 172 mg/dL — ABNORMAL HIGH (ref 70–99)
Glucose-Capillary: 236 mg/dL — ABNORMAL HIGH (ref 70–99)
Glucose-Capillary: 139 mg/dL — ABNORMAL HIGH (ref 70–99)
Glucose-Capillary: 141 mg/dL — ABNORMAL HIGH (ref 70–99)

## 2023-07-18 NOTE — Plan of Care (Signed)

## 2023-07-18 NOTE — Progress Notes (Signed)
 Physical Therapy Treatment Patient Details Name: Mary Barrera MRN: [id number] DOB: [birth year] Today's Date: 07/18/2023   History of Present Illness 58 year old female admitted with confusion, repeated falls and overall not feeling well for the past 1 week, including nausea, vomiting, poor p.o. intake.  She is supposed take insulin  at home however she stopped using that because she was not been feeling well.  She has been off her insulin  for several weeks. Dx of DKA. PMH: DM2, anxiety, chronic back pain    PT Comments  Pt able to ambulate 160 ft in hallway with RW.  Pt's mobility improving.  Pt may still have some cognitive deficits however none presenting during session.  Updated d/c recommendations for HHPT.  Pt would benefit from having initial assist available at home for cognition (admitted with confusion and not taking meds).    If plan is discharge home, recommend the following: A little help with walking and/or transfers;A little help with bathing/dressing/bathroom;Assistance with cooking/housework;Assist for transportation;Help with stairs or ramp for entrance   Can travel by private vehicle        Equipment Recommendations  Rolling walker (2 wheels)    Recommendations for Other Services       Precautions / Restrictions Precautions Precautions: Fall     Mobility  Bed Mobility Overal bed mobility: Modified Independent                  Transfers Overall transfer level: Needs assistance Equipment used: Rolling walker (2 wheels) Transfers: Sit to/from Stand Sit to Stand: Contact guard assist           General transfer comment: cues of hands to self assist    Ambulation/Gait Ambulation/Gait assistance: Contact guard assist Gait Distance (Feet): 160 Feet Assistive device: Rolling walker (2 wheels) Gait Pattern/deviations: Step-through pattern, Decreased stride length       General Gait Details: initial cue to remain inside RW but otherwise appears steady  and no overt LOB observed, pt only reports generalized weakness and fatigue   Stairs             Wheelchair Mobility     Tilt Bed    Modified Rankin (Stroke Patients Only)       Balance Overall balance assessment: Needs assistance Sitting-balance support: Feet supported, No upper extremity supported Sitting balance-Leahy Scale: Good     Standing balance support: Single extremity supported Standing balance-Leahy Scale: Poor                              Communication Communication Communication: No apparent difficulties  Cognition Arousal: Alert Behavior During Therapy: WFL for tasks assessed/performed   PT - Cognitive impairments: No apparent impairments                       PT - Cognition Comments: improved cognition today; RN reports some issues with memory today but that confusion since admission has improved        Cueing    Exercises      General Comments        Pertinent Vitals/Pain Pain Assessment Pain Assessment: No/denies pain    Home Living                          Prior Function            PT Goals (current goals can now be found in the care  plan section) Progress towards PT goals: Progressing toward goals    Frequency    Min 3X/week      PT Plan      Co-evaluation              AM-PAC PT "6 Clicks" Mobility   Outcome Measure  Help needed turning from your back to your side while in a flat bed without using bedrails?: A Little Help needed moving from lying on your back to sitting on the side of a flat bed without using bedrails?: A Little Help needed moving to and from a bed to a chair (including a wheelchair)?: A Little Help needed standing up from a chair using your arms (e.g., wheelchair or bedside chair)?: A Little Help needed to walk in hospital room?: A Little Help needed climbing 3-5 steps with a railing? : A Little 6 Click Score: 18    End of Session Equipment Utilized  During Treatment: Gait belt Activity Tolerance: Patient tolerated treatment well Patient left: Other (comment) (in bathroom, agreeable to use pull cord to notify staff of assist out of bathroom, RN aware and reports pt has been calling out) Nurse Communication: Mobility status PT Visit Diagnosis: Difficulty in walking, not elsewhere classified (R26.2)     Time: 1440-1455 PT Time Calculation (min) (ACUTE ONLY): 15 min  Charges:    $Gait Training: 8-22 mins PT General Charges $$ ACUTE PT VISIT: 1 Visit                    Blanch Bunde, DPT Physical Therapist Acute Rehabilitation Services Office: [phone]    [name] 07/18/2023, 3:43 PM

## 2023-07-18 NOTE — Progress Notes (Signed)
 PROGRESS NOTE    Mary Barrera  [id number]:[id number]  DOB: 1965/12/01  DOA: 07/13/2023 PCP: Olin Bertin, MD Outpatient Specialists:   Hospital course:  58 year old female with DM2, GAD, chronic lower back pain was admitted with DKA due to not taking her insulin  complicated by AKI.  She was treated with IV insulin  and fluids with good effect.  Hemoglobin A1c was 14 and patient was seen by the diabetes coordinator.  Patient is now awaiting disposition to SNF.  Subjective:  Patient doing okay, no new complaints, awaiting referral to SNF  Objective: Vitals:   07/17/23 1218 07/17/23 2042 07/18/23 0459 07/18/23 1402  BP: 116/65 126/60 121/65 105/65  Pulse: 78 77 83 74  Resp:  18 18 16   Temp: 98.4 F (36.9 C) 99.4 F (37.4 C) 98.5 F (36.9 C) 98.6 F (37 C)  TempSrc: Oral   Oral  SpO2: 100% 100% 100% 100%  Weight:      Height:       No intake or output data in the 24 hours ending 07/18/23 1749  Filed Weights   07/13/23 1601 07/13/23 2005  Weight: 81.2 kg 77.6 kg     Exam:  General: Well-appearing female lying flat in bed chatting with her nurse Eyes: sclera anicteric, conjuctiva mild injection bilaterally CVS: S1-S2, regular  Respiratory:  decreased air entry bilaterally secondary to decreased inspiratory effort, rales at bases  GI: NABS, soft, NT  LE: Warm and well-perfused Neuro: A/O x 3,  grossly nonfocal.   Data Reviewed:  Basic Metabolic Panel: Recent Labs  Lab 07/13/23 1630 07/13/23 1639 07/13/23 2105 07/13/23 2115 07/14/23 0009 07/14/23 0311 07/14/23 0601 07/14/23 0952 07/15/23 0451 07/17/23 0447  NA 121*   < > 132*  --    < > 130* 132* 132* 128* 135  K 6.3*   < > 5.6*  --    < > 4.6 3.9 4.0 3.7 3.2*  CL 85*   < > 96*  --    < > 97* 99 99 96* 102  CO2 10*  --  10*  --    < > 17* 19* 20* 17* 24  GLUCOSE 833*   < > 349*  --    < > 229* 149* 166* 265* 243*  BUN 48*   < > 47*  --    < > 46* 41* 38* 22* 12  CREATININE 1.76*   < > 1.57*  --    < >  1.34* 1.17* 1.06* 0.92 1.00  CALCIUM  10.1  --  10.4*  --    < > 9.7 9.9 9.5 8.7* 8.5*  MG 2.4  --  2.4  --   --  2.1  --   --  2.0 2.0  PHOS  --   --   --  3.7  --  2.8  --   --  1.5* 2.9   < > = values in this interval not displayed.    CBC: Recent Labs  Lab 07/13/23 1630 07/13/23 1639 07/14/23 0311 07/15/23 0451  WBC 10.4  --  12.6* 9.7  NEUTROABS  --   --  7.5  --   HGB 17.7* 18.0* 16.7* 13.8  HCT 49.6* 53.0* 46.7* 38.5  MCV 85.4  --  85.1 85.6  PLT 385  --  316 288     Scheduled Meds:  aspirin  EC  81 mg Oral Daily   busPIRone   30 mg Oral BID   feeding supplement  237 mL Oral BID  BM   influenza vac split trivalent PF  0.5 mL Intramuscular Tomorrow-1000   insulin  aspart  0-5 Units Subcutaneous QHS   insulin  aspart  0-9 Units Subcutaneous TID WC   insulin  aspart  8 Units Subcutaneous TID WC   insulin  glargine-yfgn  30 Units Subcutaneous Daily   ramipril   5 mg Oral Daily   Continuous Infusions:   Assessment & Plan:   DKA DM 2, uncontrolled DKA is resolved Sugars much improved with increase of glargine Continue glargine 30 units Continue aspart 8 units 3 times daily AC Appreciate diabetes coordinator involvement  Mild hyponatremia Asymptomatic Likely secondary to Pristiq , will follow  Hypophosphatemia Has been repleted, will recheck tomorrow  HTN At goal on ramipril   GAD Continue buspirone  and Pristiq   Disposition Awaiting availability of SNF bed per PT recommendations  AKI--resolved   DVT prophylaxis: SCD Code Status: Full Family Communication: None today     Studies: No results found.  Principal Problem:   DKA, type 2 (HCC) Active Problems:   Hypertension, essential   Generalized anxiety disorder   Hyponatremia   AKI (acute kidney injury) (HCC)   Hyperkalemia   Elevated troponin   DKA (diabetic ketoacidosis) (HCC)     Cinque Begley Rollene Clink, [facility]  If 7PM-7AM, please contact night-coverage [email or web address]    LOS: 4 days

## 2023-07-19 ENCOUNTER — Other Ambulatory Visit (HOSPITAL_COMMUNITY): Payer: Self-pay

## 2023-07-19 DIAGNOSIS — E1110 Type 2 diabetes mellitus with ketoacidosis without coma: Secondary | ICD-10-CM | POA: Diagnosis not present

## 2023-07-19 LAB — GLUCOSE, CAPILLARY
Glucose-Capillary: 181 mg/dL — ABNORMAL HIGH (ref 70–99)
Glucose-Capillary: 218 mg/dL — ABNORMAL HIGH (ref 70–99)
Glucose-Capillary: 98 mg/dL (ref 70–99)
Glucose-Capillary: 228 mg/dL — ABNORMAL HIGH (ref 70–99)

## 2023-07-19 LAB — BASIC METABOLIC PANEL WITH GFR
Anion gap: 11 (ref 5–15)
BUN: 5 mg/dL — ABNORMAL LOW (ref 6–20)
CO2: 24 mmol/L (ref 22–32)
Calcium: 8.4 mg/dL — ABNORMAL LOW (ref 8.9–10.3)
Chloride: 101 mmol/L (ref 98–111)
Creatinine, Ser: 0.69 mg/dL (ref 0.44–1.00)
GFR, Estimated: >60 mL/min (ref >60)
Glucose, Bld: 180 mg/dL — ABNORMAL HIGH (ref 70–99)
Potassium: 3.4 mmol/L — ABNORMAL LOW (ref 3.5–5.1)
Sodium: 136 mmol/L (ref 135–145)

## 2023-07-19 MED ORDER — LANTUS SOLOSTAR 100 UNIT/ML ~~LOC~~ SOPN
30.0000 [IU] | PEN_INJECTOR | Freq: Every day | SUBCUTANEOUS | 0 refills | Status: AC
  Filled 2023-07-19 – 2023-07-20 (×2): qty 9, 30d supply, fill #0

## 2023-07-19 MED ORDER — PEN NEEDLES 31G X 5 MM MISC
1.0000 | Freq: Three times a day (TID) | 0 refills | Status: DC
  Filled 2023-07-19: qty 100, 33d supply, fill #0
  Filled 2023-07-20: qty 100, 30d supply, fill #0

## 2023-07-19 MED ORDER — BLOOD GLUCOSE MONITOR SYSTEM W/DEVICE KIT
1.0000 | PACK | Freq: Three times a day (TID) | 0 refills | Status: DC
Start: 2023-07-19 — End: 2024-02-07
  Filled 2023-07-19: qty 1, 30d supply, fill #0
  Filled 2023-07-20: qty 1, 28d supply, fill #0

## 2023-07-19 MED ORDER — LANCET DEVICE MISC
1.0000 | Freq: Three times a day (TID) | 0 refills | Status: DC
  Filled 2023-07-19: qty 1, fill #0

## 2023-07-19 MED ORDER — BLOOD GLUCOSE TEST VI STRP
1.0000 | ORAL_STRIP | Freq: Three times a day (TID) | 0 refills | Status: DC
  Filled 2023-07-19 – 2023-07-20 (×2): qty 100, 34d supply, fill #0

## 2023-07-19 MED ORDER — POTASSIUM CHLORIDE ER 10 MEQ PO TBCR
10.0000 meq | EXTENDED_RELEASE_TABLET | Freq: Every day | ORAL | 0 refills | Status: AC
  Filled 2023-07-19 – 2023-07-20 (×2): qty 7, 7d supply, fill #0

## 2023-07-19 MED ORDER — INSULIN ASPART 100 UNIT/ML FLEXPEN
8.0000 [IU] | PEN_INJECTOR | Freq: Three times a day (TID) | SUBCUTANEOUS | 0 refills | Status: AC
  Filled 2023-07-19: qty 15, 63d supply, fill #0
  Filled 2023-07-20: qty 15, 60d supply, fill #0

## 2023-07-19 MED ORDER — POTASSIUM CHLORIDE CRYS ER 20 MEQ PO TBCR
40.0000 meq | EXTENDED_RELEASE_TABLET | Freq: Once | ORAL | Status: AC
  Administered 2023-07-19: 40 meq via ORAL
  Filled 2023-07-19: qty 2

## 2023-07-19 MED ORDER — LANCETS MISC
1.0000 | Freq: Three times a day (TID) | 0 refills | Status: DC
  Filled 2023-07-19: qty 100, 33d supply, fill #0
  Filled 2023-07-20: qty 100, 30d supply, fill #0

## 2023-07-19 MED ORDER — BUSPIRONE HCL 30 MG PO TABS
30.0000 mg | ORAL_TABLET | Freq: Two times a day (BID) | ORAL | 0 refills | Status: AC
  Filled 2023-07-19 – 2023-07-20 (×2): qty 60, 30d supply, fill #0

## 2023-07-19 NOTE — Discharge Summary (Addendum)
 Physician Discharge Summary  RYVER ZADROZNY ZOX:[id number] DOB: [birth year] DOA: 07/13/2023  PCP: Olin Bertin, MD  Admit date: 07/13/2023 Discharge date: 07/20/2023 Recommendations for Outpatient Follow-up:  Follow up with PCP in 1 weeks-call for appointment Please obtain BMP/CBC in one week  Discharge Dispo: Home ambulatory PT Discharge Condition: Stable Code Status:   Code Status: Full Code Diet recommendation:  Diet Order             Diet Carb Modified Fluid consistency: Thin; Room service appropriate? Yes  Diet effective now                   Brief/Interim Summary: 58 year old female with DM2, GAD, chronic lower back pain was admitted with DKA due to not taking her insulin  complicated by AKI. She was treated with IV insulin  and fluids with good effect. Hemoglobin A1c was 14 and patient was seen by the diabetes coordinator. Patient is now awaiting disposition to SNF. Seen by DM coordinator. Overall cbg improved. Patient at this time feels stable. Patient is being discharged with ambulatory outpatient PT  Subjective: Alert awake  No new complaint.  She is agreeable for discharge today   Discharge diagnosis: Primary problem: DKA DM 2 w/ uncontrolled hyperglycemia: Admitted with DKA which is resolved blood sugar overall stabilized Continue glargine 30 u, premeal aspart 8 u and ssi-processing provided    Mild hyponatremia: Resolved. Likely secondary to Pristiq , will follow   Hypophosphatemia Hypokalemia Was replaced.  Continue home potassium supplement for few days, follow-up with PCP   HTN Stable on ramipril     GAD Mood stable continue buspirone  and Pristiq   AKI: Resolved     Discharge Exam: Vitals:   07/20/23 0450 07/20/23 1232  BP: 103/67 (!) 94/59  Pulse: 72 68  Resp: 17 16  Temp: 98.4 F (36.9 C) 97.9 F (36.6 C)  SpO2: 95% 100%   General: Pt is alert, awake, not in acute distress Cardiovascular: RRR, S1/S2 +, no rubs, no gallops Respiratory: CTA  bilaterally, no wheezing, no rhonchi Abdominal: Soft, NT, ND, bowel sounds + Extremities: no edema, no cyanosis  Discharge Instructions  Discharge Instructions     Ambulatory referral to Physical Therapy   Complete by: As directed    Discharge instructions   Complete by: As directed    Please call call MD or return to ER for similar or worsening recurring problem that brought you to hospital or if any fever,nausea/vomiting,abdominal pain, uncontrolled pain, chest pain,  shortness of breath or any other alarming symptoms.  Please follow-up your doctor as instructed in a week time and call the office for appointment.  Please avoid alcohol, smoking, or any other illicit substance and maintain healthy habits including taking your regular medications as prescribed.  You were cared for by a hospitalist during your hospital stay. If you have any questions about your discharge medications or the care you received while you were in the hospital after you are discharged, you can call the unit and ask to speak with the hospitalist on call if the hospitalist that took care of you is not available.  Once you are discharged, your primary care physician will handle any further medical issues. Please note that NO REFILLS for any discharge medications will be authorized once you are discharged, as it is imperative that you return to your primary care physician (or establish a relationship with a primary care physician if you do not have one) for your aftercare needs so that they can reassess your need  for medications and monitor your lab values      Allergies as of 07/20/2023       Reactions   Desvenlafaxine  Shortness Of Breath   Humalog [insulin  Lispro] Shortness Of Breath   Amoxicillin Other (See Comments)   Yeast infection   Atorvastatin Other (See Comments)   Myalgias   Hydrocodone-acetaminophen  Other (See Comments)   Patient's "head feels tight" and she "feels differently"   Oxycodone Hives,  Itching   Simvastatin Itching   Tramadol Itching   Tramadol Hcl    Other Reaction(s): hive, itching        Medication List     TAKE these medications    ALPRAZolam  0.5 MG tablet Commonly known as: XANAX  Take 1 tablet (0.5 mg total) by mouth 3 (three) times daily as needed for anxiety.   aspirin  EC 81 MG tablet Take 81 mg by mouth daily.   Basaglar  KwikPen 100 UNIT/ML Inject 30 Units into the skin daily. What changed: See the new instructions.   Blood Glucose Monitor System w/Device Kit Use as directed to test blood sugar 3 times daily.   BLOOD GLUCOSE TEST STRIPS Strp Use as directed to check blood sugar 3 times daily.   busPIRone  30 MG tablet Commonly known as: BUSPAR  Take 1 tablet (30 mg total) by mouth 2 (two) times daily.   celecoxib 200 MG capsule Commonly known as: CELEBREX Take 200 mg by mouth daily as needed for mild pain (pain score 1-3).   desvenlafaxine  100 MG 24 hr tablet Commonly known as: PRISTIQ  TAKE 2 TABLETS BY MOUTH EVERY DAY What changed: how much to take   diphenhydrAMINE 25 mg capsule Commonly known as: BENADRYL Take 25 mg by mouth every 8 (eight) hours as needed for sleep.   fluticasone 50 MCG/ACT nasal spray Commonly known as: FLONASE Place 2 sprays into both nostrils 2 (two) times daily as needed for allergies.   insulin  aspart 100 UNIT/ML FlexPen Commonly known as: NOVOLOG  Inject 8 Units into the skin 3 (three) times daily with meals. If eating and Blood Glucose (BG) 80 or higher inject 8 units for meal coverage and add correction dose per scale. If not eating, correction dose only. BG <150= 0 unit; BG 150-200= 1 unit; BG 201-250= 2 unit; BG 251-300= 3 unit; BG 301-350= 4 unit; BG 351-400= 5 unit; BG >400= 6 unit and Call Primary Care   Lancet Device Misc 1 each by Does not apply route 3 (three) times daily. May dispense any manufacturer covered by patient's insurance.   Lancets Misc Use as directed to test blood sugar 3 times  daily.   Pen Needles 31G X 5 MM Misc Use 3 times daily with insulin    potassium chloride  10 MEQ tablet Commonly known as: KLOR-CON  Take 1 tablet (10 mEq total) by mouth daily for 7 days.   ramipril  5 MG capsule Commonly known as: ALTACE  Take 5 mg by mouth at bedtime.        Follow-up Information     Olin Bertin, MD Follow up in 1 week(s).   Specialty: Family Medicine Contact information: [address] [place] [address] [phone]                Allergies  Allergen Reactions   Desvenlafaxine  Shortness Of Breath   Humalog [Insulin  Lispro] Shortness Of Breath   Amoxicillin Other (See Comments)    Yeast infection   Atorvastatin Other (See Comments)    Myalgias   Hydrocodone-Acetaminophen  Other (See Comments)  Patient's "head feels tight" and she "feels differently"   Oxycodone Hives and Itching   Simvastatin Itching   Tramadol Itching   Tramadol Hcl     Other Reaction(s): hive, itching    The results of significant diagnostics from this hospitalization (including imaging, microbiology, ancillary and laboratory) are listed below for reference.    Microbiology: Recent Results (from the past 240 hours)  Resp panel by RT-PCR (RSV, Flu A&B, Covid) Anterior Nasal Swab     Status: None   Collection Time: 07/13/23  8:17 PM   Specimen: Anterior Nasal Swab  Result Value Ref Range Status   SARS Coronavirus 2 by RT PCR NEGATIVE NEGATIVE Final    Comment: (NOTE) SARS-CoV-2 target nucleic acids are NOT DETECTED.  The SARS-CoV-2 RNA is generally detectable in upper respiratory specimens during the acute phase of infection. The lowest concentration of SARS-CoV-2 viral copies this assay can detect is 138 copies/mL. A negative result does not preclude SARS-Cov-2 infection and should not be used as the sole basis for treatment or other patient management decisions. A negative result may occur with  improper specimen collection/handling,  submission of specimen other than nasopharyngeal swab, presence of viral mutation(s) within the areas targeted by this assay, and inadequate number of viral copies(<138 copies/mL). A negative result must be combined with clinical observations, patient history, and epidemiological information. The expected result is Negative.  Fact Sheet for Patients:  BloggerCourse.com  Fact Sheet for Healthcare Providers:  SeriousBroker.it  This test is no t yet approved or cleared by the [place]  FDA and  has been authorized for detection and/or diagnosis of SARS-CoV-2 by FDA under an Emergency Use Authorization (EUA). This EUA will remain  in effect (meaning this test can be used) for the duration of the COVID-19 declaration under Section 564(b)(1) of the Act, 21 U.S.C.section 360bbb-3(b)(1), unless the authorization is terminated  or revoked sooner.       Influenza A by PCR NEGATIVE NEGATIVE Final   Influenza B by PCR NEGATIVE NEGATIVE Final    Comment: (NOTE) The Xpert Xpress SARS-CoV-2/FLU/RSV plus assay is intended as an aid in the diagnosis of influenza from Nasopharyngeal swab specimens and should not be used as a sole basis for treatment. Nasal washings and aspirates are unacceptable for Xpert Xpress SARS-CoV-2/FLU/RSV testing.  Fact Sheet for Patients: BloggerCourse.com  Fact Sheet for Healthcare Providers: SeriousBroker.it  This test is not yet approved or cleared by the [place]  FDA and has been authorized for detection and/or diagnosis of SARS-CoV-2 by FDA under an Emergency Use Authorization (EUA). This EUA will remain in effect (meaning this test can be used) for the duration of the COVID-19 declaration under Section 564(b)(1) of the Act, 21 U.S.C. section 360bbb-3(b)(1), unless the authorization is terminated or revoked.     Resp Syncytial Virus by PCR NEGATIVE  NEGATIVE Final    Comment: (NOTE) Fact Sheet for Patients: BloggerCourse.com  Fact Sheet for Healthcare Providers: SeriousBroker.it  This test is not yet approved or cleared by the [place]  FDA and has been authorized for detection and/or diagnosis of SARS-CoV-2 by FDA under an Emergency Use Authorization (EUA). This EUA will remain in effect (meaning this test can be used) for the duration of the COVID-19 declaration under Section 564(b)(1) of the Act, 21 U.S.C. section 360bbb-3(b)(1), unless the authorization is terminated or revoked.  Performed at [facility], [address] [place], [place] [address]   MRSA Next Gen by PCR, Nasal     Status:  None   Collection Time: 07/13/23  9:14 PM   Specimen: Nasal Mucosa; Nasal Swab  Result Value Ref Range Status   MRSA by PCR Next Gen NOT DETECTED NOT DETECTED Final    Comment: (NOTE) The GeneXpert MRSA Assay (FDA approved for NASAL specimens only), is one component of a comprehensive MRSA colonization surveillance program. It is not intended to diagnose MRSA infection nor to guide or monitor treatment for MRSA infections. Test performance is not FDA approved in patients less than 3 years old. Performed at [facility], [address] [place], [place] [address]     Procedures/Studies: ECHOCARDIOGRAM COMPLETE Result Date: 07/14/2023    ECHOCARDIOGRAM REPORT   Patient Name:   Mary Barrera Date of Exam: 07/14/2023 Medical Rec #:  [id number]      Height:       66.0 in Accession #:    [id number]     Weight:       171.1 lb Date of Birth:  [birth year]       BSA:          1.871 m Patient Age:    57 years       BP:           100/65 mmHg Patient Gender: F              HR:           90 bpm. Exam Location:  Inpatient Procedure: 2D Echo, Cardiac Doppler, Color Doppler and Intracardiac            Opacification Agent (Both Spectral and Color Flow Doppler were             utilized during procedure). Indications:    Elevated Troponin  History:        Patient has no prior history of Echocardiogram examinations.                 Signs/Symptoms:Shortness of Breath; Risk Factors:Hypertension,                 Diabetes and HLD.  Sonographer:    Willey Harrier Referring Phys: [id number] ANASTASSIA DOUTOVA IMPRESSIONS  1. Hyperdynamic LV with dynamic outflow gradient, with valsalva. Given LVH, dynamic obstruction, and reduced e' can consider CMR to evaluate for hypertrophic cardiomyopathy if clinically indicated. Can also see in hypovolemia and high cardiac output states.. Left ventricular ejection fraction, by estimation, is 70 to 75%. The left ventricle has hyperdynamic function. The left ventricle has no regional wall motion abnormalities. There is moderate concentric left ventricular hypertrophy. Left ventricular diastolic parameters are consistent with Grade I diastolic dysfunction (impaired relaxation).  2. Right ventricular systolic function is normal. The right ventricular size is normal.  3. The mitral valve is normal in structure. No evidence of mitral valve regurgitation. No evidence of mitral stenosis.  4. The aortic valve is tricuspid. Aortic valve regurgitation is not visualized. No aortic stenosis is present.  5. The inferior vena cava is normal in size with greater than 50% respiratory variability, suggesting right atrial pressure of 3 mmHg. FINDINGS  Left Ventricle: Hyperdynamic LV with dynamic outflow gradient, with valsalva. Given LVH, dynamic obstruction, and reduced e' can consider CMR to evaluate for hypertrophic cardiomyopathy if clinically indicated. Can also see in hypovolemia and high cardiac output states. Left ventricular ejection fraction, by estimation, is 70 to 75%. The left ventricle has hyperdynamic function. The left ventricle has no regional wall motion abnormalities. The left ventricular internal cavity  size was normal in size. There is  moderate concentric left ventricular hypertrophy. Left ventricular diastolic parameters are consistent with Grade I diastolic dysfunction (impaired relaxation). Right Ventricle: The right ventricular size is normal. No increase in right ventricular wall thickness. Right ventricular systolic function is normal. Left Atrium: Left atrial size was normal in size. Right Atrium: Right atrial size was normal in size. Pericardium: There is no evidence of pericardial effusion. Mitral Valve: The mitral valve is normal in structure. No evidence of mitral valve regurgitation. No evidence of mitral valve stenosis. MV peak gradient, 3.5 mmHg. The mean mitral valve gradient is 1.0 mmHg. Tricuspid Valve: The tricuspid valve is normal in structure. Tricuspid valve regurgitation is not demonstrated. No evidence of tricuspid stenosis. Aortic Valve: The aortic valve is tricuspid. Aortic valve regurgitation is not visualized. No aortic stenosis is present. Pulmonic Valve: The pulmonic valve was normal in structure. Pulmonic valve regurgitation is not visualized. No evidence of pulmonic stenosis. Aorta: The aortic root and ascending aorta are structurally normal, with no evidence of dilitation. Venous: The inferior vena cava is normal in size with greater than 50% respiratory variability, suggesting right atrial pressure of 3 mmHg. IAS/Shunts: No atrial level shunt detected by color flow Doppler.  LEFT VENTRICLE PLAX 2D LVIDd:         2.40 cm   Diastology LVIDs:         1.20 cm   LV e' medial:    3.15 cm/s LV PW:         1.40 cm   LV E/e' medial:  18.2 LV IVS:        1.60 cm   LV e' lateral:   6.85 cm/s LVOT diam:     2.00 cm   LV E/e' lateral: 8.4 LVOT Area:     3.14 cm  RIGHT VENTRICLE          IVC RV Basal diam:  3.10 cm  IVC diam: 1.40 cm LEFT ATRIUM             Index LA Vol (A2C):   23.0 ml 12.29 ml/m LA Vol (A4C):   18.2 ml 9.73 ml/m LA Biplane Vol: 20.6 ml 11.01 ml/m                        PULMONIC VALVE AORTA                  PV Vmax:       1.90 m/s Ao Root diam: 3.10 cm PV Peak grad:  14.4 mmHg Ao Asc diam:  2.90 cm  MITRAL VALVE MV Area (PHT): 3.33 cm    SHUNTS MV Peak grad:  3.5 mmHg    Systemic Diam: 2.00 cm MV Mean grad:  1.0 mmHg MV Vmax:       0.93 m/s MV Vmean:      49.7 cm/s MV Decel Time: 228 msec MV E velocity: 57.40 cm/s MV A velocity: 66.20 cm/s MV E/A ratio:  0.87 Arta Lark Electronically signed by Arta Lark Signature Date/Time: 07/14/2023/10:01:17 AM    Final    DG Chest Portable 1 View Result Date: 07/13/2023 CLINICAL DATA:  DKA EXAM: PORTABLE CHEST 1 VIEW COMPARISON:  January 10, 2019 FINDINGS: No focal airspace consolidation, pleural effusion, or pneumothorax. No cardiomegaly. No acute fracture or destructive lesion. IMPRESSION: No acute cardiopulmonary abnormality. Electronically Signed   By: Rance Burrows M.D.   On: 07/13/2023 20:08   CT Head Wo Contrast Result  Date: 07/13/2023 CLINICAL DATA:  Head trauma, abnormal mental status (Age 32-64y) multiple falls in the last three days with head trauma, dizziness, light headedness, nausea. Multiple recent falls. Dizziness, lightheadedness, and nausea. EXAM: CT HEAD WITHOUT CONTRAST TECHNIQUE: Contiguous axial images were obtained from the base of the skull through the vertex without intravenous contrast. RADIATION DOSE REDUCTION: This exam was performed according to the departmental dose-optimization program which includes automated exposure control, adjustment of the mA and/or kV according to patient size and/or use of iterative reconstruction technique. COMPARISON:  None Available. FINDINGS: Brain: There is no evidence of an acute infarct, intracranial hemorrhage, mass, midline shift, or extra-axial fluid collection. Cerebral volume is within normal limits for age with normal size of the ventricles. Cerebral white matter hypodensities are nonspecific but compatible with mild chronic small vessel ischemic disease. Vascular: No hyperdense vessel. Skull:  No acute fracture or suspicious lesion. Sinuses/Orbits: Visualized paranasal sinuses and mastoid air cells SCRATCH the visualized paranasal sinuses are clear. Trace left mastoid fluid. Bilateral cataract extraction. Other: None. IMPRESSION: 1. No evidence of acute intracranial abnormality. 2. Mild chronic small vessel ischemic disease. Electronically Signed   By: Aundra Lee M.D.   On: 07/13/2023 18:41    Labs: BNP (last 3 results) No results for input(s): "BNP" in the last 8760 hours. Basic Metabolic Panel: Recent Labs  Lab 07/13/23 1630 07/13/23 1639 07/13/23 2105 07/13/23 2115 07/14/23 0009 07/14/23 1610 07/14/23 0601 07/14/23 9604 07/15/23 0451 07/17/23 0447 07/19/23 1223  NA 121*   < > 132*  --    < > 130* 132* 132* 128* 135 136  K 6.3*   < > 5.6*  --    < > 4.6 3.9 4.0 3.7 3.2* 3.4*  CL 85*   < > 96*  --    < > 97* 99 99 96* 102 101  CO2 10*  --  10*  --    < > 17* 19* 20* 17* 24 24  GLUCOSE 833*   < > 349*  --    < > 229* 149* 166* 265* 243* 180*  BUN 48*   < > 47*  --    < > 46* 41* 38* 22* 12 5*  CREATININE 1.76*   < > 1.57*  --    < > 1.34* 1.17* 1.06* 0.92 1.00 0.69  CALCIUM  10.1  --  10.4*  --    < > 9.7 9.9 9.5 8.7* 8.5* 8.4*  MG 2.4  --  2.4  --   --  2.1  --   --  2.0 2.0  --   PHOS  --   --   --  3.7  --  2.8  --   --  1.5* 2.9  --    < > = values in this interval not displayed.   Liver Function Tests: Recent Labs  Lab 07/13/23 1630 07/15/23 0451  AST 12* 16  ALT 15 13  ALKPHOS 81 60  BILITOT 1.7* 1.2  PROT 7.6 5.6*  ALBUMIN 3.8 2.8*   No results for input(s): "LIPASE", "AMYLASE" in the last 168 hours. No results for input(s): "AMMONIA" in the last 168 hours. CBC: Recent Labs  Lab 07/13/23 1630 07/13/23 1639 07/14/23 0311 07/15/23 0451  WBC 10.4  --  12.6* 9.7  NEUTROABS  --   --  7.5  --   HGB 17.7* 18.0* 16.7* 13.8  HCT 49.6* 53.0* 46.7* 38.5  MCV 85.4  --  85.1 85.6  PLT 385  --  316 288   Cardiac Enzymes: Recent Labs  Lab  07/13/23 2115  CKTOTAL 97   BNP: Invalid input(s): "POCBNP" CBG: Recent Labs  Lab 07/19/23 1152 07/19/23 1803 07/19/23 2050 07/20/23 0751 07/20/23 1233  GLUCAP 218* 98 228* 225* 188*  Urinalysis    Component Value Date/Time   COLORURINE STRAW (A) 07/13/2023 1817   APPEARANCEUR CLEAR 07/13/2023 1817   LABSPEC 1.023 07/13/2023 1817   PHURINE 5.0 07/13/2023 1817   GLUCOSEU >=500 (A) 07/13/2023 1817   HGBUR SMALL (A) 07/13/2023 1817   BILIRUBINUR NEGATIVE 07/13/2023 1817   KETONESUR 80 (A) 07/13/2023 1817   PROTEINUR NEGATIVE 07/13/2023 1817   NITRITE NEGATIVE 07/13/2023 1817   LEUKOCYTESUR NEGATIVE 07/13/2023 1817   Sepsis Labs Recent Labs  Lab 07/13/23 1630 07/14/23 0311 07/15/23 0451  WBC 10.4 12.6* 9.7   Microbiology Recent Results (from the past 240 hours)  Resp panel by RT-PCR (RSV, Flu A&B, Covid) Anterior Nasal Swab     Status: None   Collection Time: 07/13/23  8:17 PM   Specimen: Anterior Nasal Swab  Result Value Ref Range Status   SARS Coronavirus 2 by RT PCR NEGATIVE NEGATIVE Final    Comment: (NOTE) SARS-CoV-2 target nucleic acids are NOT DETECTED.  The SARS-CoV-2 RNA is generally detectable in upper respiratory specimens during the acute phase of infection. The lowest concentration of SARS-CoV-2 viral copies this assay can detect is 138 copies/mL. A negative result does not preclude SARS-Cov-2 infection and should not be used as the sole basis for treatment or other patient management decisions. A negative result may occur with  improper specimen collection/handling, submission of specimen other than nasopharyngeal swab, presence of viral mutation(s) within the areas targeted by this assay, and inadequate number of viral copies(<138 copies/mL). A negative result must be combined with clinical observations, patient history, and epidemiological information. The expected result is Negative.  Fact Sheet for Patients:   BloggerCourse.com  Fact Sheet for Healthcare Providers:  SeriousBroker.it  This test is no t yet approved or cleared by the [place]  FDA and  has been authorized for detection and/or diagnosis of SARS-CoV-2 by FDA under an Emergency Use Authorization (EUA). This EUA will remain  in effect (meaning this test can be used) for the duration of the COVID-19 declaration under Section 564(b)(1) of the Act, 21 U.S.C.section 360bbb-3(b)(1), unless the authorization is terminated  or revoked sooner.       Influenza A by PCR NEGATIVE NEGATIVE Final   Influenza B by PCR NEGATIVE NEGATIVE Final    Comment: (NOTE) The Xpert Xpress SARS-CoV-2/FLU/RSV plus assay is intended as an aid in the diagnosis of influenza from Nasopharyngeal swab specimens and should not be used as a sole basis for treatment. Nasal washings and aspirates are unacceptable for Xpert Xpress SARS-CoV-2/FLU/RSV testing.  Fact Sheet for Patients: BloggerCourse.com  Fact Sheet for Healthcare Providers: SeriousBroker.it  This test is not yet approved or cleared by the [place]  FDA and has been authorized for detection and/or diagnosis of SARS-CoV-2 by FDA under an Emergency Use Authorization (EUA). This EUA will remain in effect (meaning this test can be used) for the duration of the COVID-19 declaration under Section 564(b)(1) of the Act, 21 U.S.C. section 360bbb-3(b)(1), unless the authorization is terminated or revoked.     Resp Syncytial Virus by PCR NEGATIVE NEGATIVE Final    Comment: (NOTE) Fact Sheet for Patients: BloggerCourse.com  Fact Sheet for Healthcare Providers: SeriousBroker.it  This test is not yet approved or cleared by the [place]  [place] FDA and has been authorized for detection and/or diagnosis of SARS-CoV-2 by FDA under an Emergency Use  Authorization (EUA). This EUA will remain in effect (meaning this test can be used) for the duration of the COVID-19 declaration under Section 564(b)(1) of the Act, 21 U.S.C. section 360bbb-3(b)(1), unless the authorization is terminated or revoked.  Performed at [facility], [address] [place], [place] [address]   MRSA Next Gen by PCR, Nasal     Status: None   Collection Time: 07/13/23  9:14 PM   Specimen: Nasal Mucosa; Nasal Swab  Result Value Ref Range Status   MRSA by PCR Next Gen NOT DETECTED NOT DETECTED Final    Comment: (NOTE) The GeneXpert MRSA Assay (FDA approved for NASAL specimens only), is one component of a comprehensive MRSA colonization surveillance program. It is not intended to diagnose MRSA infection nor to guide or monitor treatment for MRSA infections. Test performance is not FDA approved in patients less than 39 years old. Performed at [facility], [address] [place], [place] [address]    Time coordinating discharge: 35 minutes  SIGNED: Lesa Rape, MD  [facility] 07/20/2023, 2:01 PM  If 7PM-7AM, please contact night-coverage [email or web address]

## 2023-07-19 NOTE — Hospital Course (Addendum)
 58 year old female with DM2, GAD, chronic lower back pain was admitted with DKA due to not taking her insulin  complicated by AKI. She was treated with IV insulin  and fluids with good effect. Hemoglobin A1c was 14 and patient was seen by the diabetes coordinator. Patient is now awaiting disposition to SNF. Seen by DM coordinator. Overall cbg improved. Patient at this time feels stable. Patient is being discharged with ambulatory outpatient PT  Subjective: Alert awake  No new complaint.  She is agreeable for discharge today   Discharge diagnosis: Primary problem: DKA DM 2 w/ uncontrolled hyperglycemia: Admitted with DKA which is resolved blood sugar overall stabilized Continue glargine 30 u, premeal aspart 8 u and ssi-processing provided    Mild hyponatremia: Resolved. Likely secondary to Pristiq , will follow   Hypophosphatemia Hypokalemia Was replaced.  Continue home potassium supplement for few days, follow-up with PCP   HTN Stable on ramipril     GAD Mood stable continue buspirone  and Pristiq   AKI: Resolved

## 2023-07-19 NOTE — TOC Progression Note (Addendum)
 Transition of Care [facility]) - Progression Note    Patient Details  Name: Mary Barrera MRN: [id number] Date of Birth: 06-04-1965  Transition of [facility]) CM/SW Contact  Kathryn Parish, RN Phone Number: 07/19/2023, 9:55 AM  Clinical Narrative:    PASRR requested additional 30 Day Note. Did not upload to [place] [facility] on Friday. NCM has re submitted documents to [place] [facility]. 2:48 PM Patient has recs for [facility] and seems to have improved significantly with her mobility. NCM has sent out mulitple request, no agency has accepted patient. [facility] - No [facility] - No [facility] - No [facility] - No [facility] - No [facility] - No  Patient MD made aware.       Expected Discharge Plan and Services                                               Social Determinants of Health (SDOH) Interventions SDOH Screenings   Food Insecurity: Patient Unable To Answer (07/14/2023)  Housing: Patient Unable To Answer (07/14/2023)  Transportation Needs: Patient Unable To Answer (07/14/2023)  Utilities: Patient Unable To Answer (07/14/2023)  Depression (PHQ2-9): Low Risk  (07/04/2018)  Financial Resource Strain: Low Risk  (07/04/2018)  Physical Activity: Insufficiently Active (07/04/2018)  Social Connections: Moderately Isolated (07/14/2023)  Stress: Stress Concern Present (07/04/2018)  Tobacco Use: Low Risk  (07/14/2023)  Recent Concern: Tobacco Use - Medium Risk (06/24/2023)   Received from [facility]    Readmission Risk Interventions     No data to display

## 2023-07-19 NOTE — Progress Notes (Signed)
 PROGRESS NOTE Mary Barrera  ZOX:[id number] DOB: 01-11-1966 DOA: 07/13/2023 PCP: Olin Bertin, MD  Brief Narrative/Hospital Course: 58 year old female with DM2, GAD, chronic lower back pain was admitted with DKA due to not taking her insulin  complicated by AKI. She was treated with IV insulin  and fluids with good effect. Hemoglobin A1c was 14 and patient was seen by the diabetes coordinator. Patient is now awaiting disposition to SNF. Seen by DM coordinator. Overall cbg improved. Patient at this time feels stable.   Subjective: Alert awake oriented resting comfortably Does not feel comfortable going home today Overnight afebrile BP stable on room air. Blood sugar running in the 130s to 230s.  Discharge diagnosis: Primary problem: DKA DM 2 w/ uncontrolled hyperglycemia: Admitted with DKA which is resolved blood sugar overall stabilized Continue glargine 30 u, premeal aspart 8 u and ssi-processing provided    Mild hyponatremia: Resolved. Likely secondary to Pristiq , will follow   Hypophosphatemia Hypokalemia Was replaced.  Continue home potassium supplement for few days, follow-up with PCP   HTN Stable on ramipril     GAD Mood stable continue buspirone  and Pristiq   AKI: Resolved   DVT prophylaxis: SCDs Start: 07/13/23 2203 Code Status:   Code Status: Full Code Family Communication: plan of care discussed with patien at bedside. Patient status is: Remains hospitalized because of severity of illness Level of care: Med-Surg   Dispo: The patient is from: home with son            Anticipated disposition:  awaiting SNF, but she wants to consider home now w/ [facility] if able to get. TOC informed no HH agency available. Patient does not feel comfortable going home today  Objective: Vitals last 24 hrs: Vitals:   07/19/23 0500 07/19/23 1005 07/19/23 1151 07/19/23 1157  BP: 115/67 115/67 115/70 136/77  Pulse: 79  72 (!) 101  Resp: 18  16 16   Temp: 98.6 F (37 C)  98.8 F (37.1 C)  97.7 F (36.5 C)  TempSrc: Oral  Oral Oral  SpO2: 100%  100% 99%  Weight:      Height:       Weight change:   Physical Examination: General exam: alert awake, older than stated age HEENT:Oral mucosa moist, Ear/Nose WNL grossly Respiratory system: Bilaterally diminished BS, no use of accessory muscle Cardiovascular system: S1 & S2 +. Gastrointestinal system: Abdomen soft, NT,ND,BS+ Nervous System: Alert, awake,following commands. Extremities: LE edema neg, moving armslegs, warm legs Skin: No rashes,warm. MSK: Normal muscle bulk/tone.   Medications reviewed:  Scheduled Meds:  aspirin  EC  81 mg Oral Daily   busPIRone   30 mg Oral BID   feeding supplement  237 mL Oral BID BM   influenza vac split trivalent PF  0.5 mL Intramuscular Tomorrow-1000   insulin  aspart  0-5 Units Subcutaneous QHS   insulin  aspart  0-9 Units Subcutaneous TID WC   insulin  aspart  8 Units Subcutaneous TID WC   insulin  glargine-yfgn  30 Units Subcutaneous Daily   ramipril   5 mg Oral Daily   Continuous Infusions:    Diet Order             Diet Carb Modified Fluid consistency: Thin; Room service appropriate? Yes  Diet effective now                   Intake/Output Summary (Last 24 hours) at 07/19/2023 1551 Last data filed at 07/19/2023 0800 Gross per 24 hour  Intake 240 ml  Output --  Net 240 ml  Net IO Since Admission: 3,970.26 mL [07/19/23 1551]  Wt Readings from Last 3 Encounters:  07/13/23 77.6 kg  02/28/19 94 kg  01/10/19 85.7 kg    Unresulted Labs (From admission, onward)    None      Data Reviewed: I have personally reviewed following labs and imaging studies ( see epic result tab) CBC: Recent Labs  Lab 07/13/23 1630 07/13/23 1639 07/14/23 0311 07/15/23 0451  WBC 10.4  --  12.6* 9.7  NEUTROABS  --   --  7.5  --   HGB 17.7* 18.0* 16.7* 13.8  HCT 49.6* 53.0* 46.7* 38.5  MCV 85.4  --  85.1 85.6  PLT 385  --  316 288   CMP: Recent Labs  Lab 07/13/23 1630  07/13/23 1639 07/13/23 2105 07/13/23 2115 07/14/23 0009 07/14/23 0311 07/14/23 0601 07/14/23 0952 07/15/23 0451 07/17/23 0447 07/19/23 1223  NA 121*   < > 132*  --    < > 130* 132* 132* 128* 135 136  K 6.3*   < > 5.6*  --    < > 4.6 3.9 4.0 3.7 3.2* 3.4*  CL 85*   < > 96*  --    < > 97* 99 99 96* 102 101  CO2 10*  --  10*  --    < > 17* 19* 20* 17* 24 24  GLUCOSE 833*   < > 349*  --    < > 229* 149* 166* 265* 243* 180*  BUN 48*   < > 47*  --    < > 46* 41* 38* 22* 12 5*  CREATININE 1.76*   < > 1.57*  --    < > 1.34* 1.17* 1.06* 0.92 1.00 0.69  CALCIUM  10.1  --  10.4*  --    < > 9.7 9.9 9.5 8.7* 8.5* 8.4*  MG 2.4  --  2.4  --   --  2.1  --   --  2.0 2.0  --   PHOS  --   --   --  3.7  --  2.8  --   --  1.5* 2.9  --    < > = values in this interval not displayed.   GFR: Estimated Creatinine Clearance: 79.6 mL/min (by C-G formula based on SCr of 0.69 mg/dL). Recent Labs  Lab 07/13/23 1630 07/15/23 0451  AST 12* 16  ALT 15 13  ALKPHOS 81 60  BILITOT 1.7* 1.2  PROT 7.6 5.6*  ALBUMIN 3.8 2.8*   No results for input(s): "LIPASE", "AMYLASE" in the last 168 hours. No results for input(s): "AMMONIA" in the last 168 hours. Recent Results (from the past 240 hours)  Resp panel by RT-PCR (RSV, Flu A&B, Covid) Anterior Nasal Swab     Status: None   Collection Time: 07/13/23  8:17 PM   Specimen: Anterior Nasal Swab  Result Value Ref Range Status   SARS Coronavirus 2 by RT PCR NEGATIVE NEGATIVE Final    Comment: (NOTE) SARS-CoV-2 target nucleic acids are NOT DETECTED.  The SARS-CoV-2 RNA is generally detectable in upper respiratory specimens during the acute phase of infection. The lowest concentration of SARS-CoV-2 viral copies this assay can detect is 138 copies/mL. A negative result does not preclude SARS-Cov-2 infection and should not be used as the sole basis for treatment or other patient management decisions. A negative result may occur with  improper specimen  collection/handling, submission of specimen other than nasopharyngeal swab, presence of viral mutation(s) within the areas  targeted by this assay, and inadequate number of viral copies(<138 copies/mL). A negative result must be combined with clinical observations, patient history, and epidemiological information. The expected result is Negative.  Fact Sheet for Patients:  BloggerCourse.com  Fact Sheet for Healthcare Providers:  SeriousBroker.it  This test is no t yet approved or cleared by the [place]  FDA and  has been authorized for detection and/or diagnosis of SARS-CoV-2 by FDA under an Emergency Use Authorization (EUA). This EUA will remain  in effect (meaning this test can be used) for the duration of the COVID-19 declaration under Section 564(b)(1) of the Act, 21 U.S.C.section 360bbb-3(b)(1), unless the authorization is terminated  or revoked sooner.       Influenza A by PCR NEGATIVE NEGATIVE Final   Influenza B by PCR NEGATIVE NEGATIVE Final    Comment: (NOTE) The Xpert Xpress SARS-CoV-2/FLU/RSV plus assay is intended as an aid in the diagnosis of influenza from Nasopharyngeal swab specimens and should not be used as a sole basis for treatment. Nasal washings and aspirates are unacceptable for Xpert Xpress SARS-CoV-2/FLU/RSV testing.  Fact Sheet for Patients: BloggerCourse.com  Fact Sheet for Healthcare Providers: SeriousBroker.it  This test is not yet approved or cleared by the [place]  FDA and has been authorized for detection and/or diagnosis of SARS-CoV-2 by FDA under an Emergency Use Authorization (EUA). This EUA will remain in effect (meaning this test can be used) for the duration of the COVID-19 declaration under Section 564(b)(1) of the Act, 21 U.S.C. section 360bbb-3(b)(1), unless the authorization is terminated or revoked.     Resp Syncytial  Virus by PCR NEGATIVE NEGATIVE Final    Comment: (NOTE) Fact Sheet for Patients: BloggerCourse.com  Fact Sheet for Healthcare Providers: SeriousBroker.it  This test is not yet approved or cleared by the [place]  FDA and has been authorized for detection and/or diagnosis of SARS-CoV-2 by FDA under an Emergency Use Authorization (EUA). This EUA will remain in effect (meaning this test can be used) for the duration of the COVID-19 declaration under Section 564(b)(1) of the Act, 21 U.S.C. section 360bbb-3(b)(1), unless the authorization is terminated or revoked.  Performed at [facility], [address], [place], [place] [address]   MRSA Next Gen by PCR, Nasal     Status: None   Collection Time: 07/13/23  9:14 PM   Specimen: Nasal Mucosa; Nasal Swab  Result Value Ref Range Status   MRSA by PCR Next Gen NOT DETECTED NOT DETECTED Final    Comment: (NOTE) The GeneXpert MRSA Assay (FDA approved for NASAL specimens only), is one component of a comprehensive MRSA colonization surveillance program. It is not intended to diagnose MRSA infection nor to guide or monitor treatment for MRSA infections. Test performance is not FDA approved in patients less than 59 years old. Performed at [facility], [address], [place], [place] [address]     Antimicrobials/Microbiology: Anti-infectives (From admission, onward)    None         Component Value Date/Time   SDES BLOOD RIGHT ANTECUBITAL 01/10/2019 2025   SPECREQUEST  01/10/2019 2025    BOTTLES DRAWN AEROBIC AND ANAEROBIC Blood Culture adequate volume   CULT  01/10/2019 2025    NO GROWTH 5 DAYS Performed at [facility], [address], [place], [place] [address]    REPTSTATUS 01/15/2019 FINAL 01/10/2019 2025    Radiology Studies: No results found.  LOS: 5 days    Total time spent in review of labs  and imaging, patient  evaluation, formulation of plan, documentation and communication with patient/family: 35 minutes  Lesa Rape, MD [facility] 07/19/2023, 3:51 PM

## 2023-07-19 NOTE — Plan of Care (Signed)

## 2023-07-20 ENCOUNTER — Other Ambulatory Visit (HOSPITAL_COMMUNITY): Payer: Self-pay

## 2023-07-20 DIAGNOSIS — E1110 Type 2 diabetes mellitus with ketoacidosis without coma: Secondary | ICD-10-CM | POA: Diagnosis not present

## 2023-07-20 LAB — GLUCOSE, CAPILLARY
Glucose-Capillary: 225 mg/dL — ABNORMAL HIGH (ref 70–99)
Glucose-Capillary: 188 mg/dL — ABNORMAL HIGH (ref 70–99)

## 2023-07-20 NOTE — Progress Notes (Signed)
 Patient discharged home, family to facilitate transportation. IV removed, tolerated well. Discharge packet given and instructions reviewed. Questions answered. F/u appointments discussed. Belongings with patient.

## 2023-07-20 NOTE — TOC Transition Note (Signed)
 Transition of Care [facility]) - Discharge Note   Patient Details  Name: Mary Barrera MRN: [id number] Date of Birth: 1965/05/13  [facility]) CM/SW Contact:  Kathryn Parish, RN Phone Number: 07/20/2023, 2:26 PM   Clinical Narrative:    Pt to return home with son. Pt will be receiving outpatient rehab. DME 4 wheel rollator ordered with [employer]. TOC signing off.    Final next level of care: Other (comment) (Out Patient Rehab) Barriers to Discharge: Barriers Resolved   Patient Goals and CMS Choice            Discharge Placement                       Discharge Plan and Services Additional resources added to the After Visit Summary for                  DME Arranged: Walker rolling with seat DME Agency: [employer] Date DME Agency Contacted: 07/20/23 Time DME Agency Contacted: 1400 Representative spoke with at DME Agency: April HH Arranged: NA HH Agency: NA        Social Drivers of Health (SDOH) Interventions SDOH Screenings   Food Insecurity: Patient Unable To Answer (07/14/2023)  Housing: Patient Unable To Answer (07/14/2023)  Transportation Needs: Patient Unable To Answer (07/14/2023)  Utilities: Patient Unable To Answer (07/14/2023)  Depression (PHQ2-9): Low Risk  (07/04/2018)  Financial Resource Strain: Low Risk  (07/04/2018)  Physical Activity: Insufficiently Active (07/04/2018)  Social Connections: Moderately Isolated (07/14/2023)  Stress: Stress Concern Present (07/04/2018)  Tobacco Use: Low Risk  (07/14/2023)  Recent Concern: Tobacco Use - Medium Risk (06/24/2023)   Received from [facility]     Readmission Risk Interventions     No data to display

## 2023-07-20 NOTE — Progress Notes (Signed)
 Physical Therapy Treatment Patient Details Name: Mary Barrera MRN: [id number] DOB: 08-01-1965 Today's Date: 07/20/2023   History of Present Illness 58 year old female admitted with confusion, repeated falls and overall not feeling well for the past 1 week, including nausea, vomiting, poor p.o. intake.  She is supposed take insulin  at home however she stopped using that because she was not been feeling well.  She has been off her insulin  for several weeks. Dx of DKA. PMH: DM2, anxiety, chronic back pain    PT Comments  PT - Cognition Comments: AxO x 3 pleasant Retired [employer] who lives home alone. Pt amb self around her room using RW.  Pt c/o B feet edema/pain.  Assisted with amb in hallway.  General Gait Details: trial amb without AD using safety belt.  Pt tolerated a functional distance with decreased gait speed and occassional need to steady self using hall rail.  Rec no AD wiithin home and a 4WW Rollator for community use. Performed a BERG balance test which Pt scored 40/56 indicating FALL RISK with higher level activity.   Pt plans to D/C to home and LPT has rec [facility] PT.  Pt would also benefit from a 4WW Rollator for Community use.    If plan is discharge home, recommend the following: A little help with walking and/or transfers;A little help with bathing/dressing/bathroom;Assistance with cooking/housework;Assist for transportation;Help with stairs or ramp for entrance   Can travel by private vehicle     Yes  Equipment Recommendations  Rollator (4 wheels)    Recommendations for Other Services       Precautions / Restrictions Precautions Precautions: Fall Precaution/Restrictions Comments: reports 3 falls in past 6 months Restrictions Weight Bearing Restrictions Per Provider Order: No     Mobility  Bed Mobility Overal bed mobility: Modified Independent             General bed mobility comments: self able    Transfers Overall transfer level: Modified  independent                 General transfer comment: self able with good safety cognition and use of hands to steady self.    Ambulation/Gait Ambulation/Gait assistance: Contact guard assist, Supervision Gait Distance (Feet): 135 Feet Assistive device: None Gait Pattern/deviations: Step-through pattern, Decreased stride length Gait velocity: decreased     General Gait Details: trial amb without AD using safety belt.  Pt tolerated a functional distance with decreased gait speed and occassional need to steady self using hall rail.  Rec no AD wiithin home and a 4WW Rollator for community use.   Stairs             Wheelchair Mobility     Tilt Bed    Modified Rankin (Stroke Patients Only)       Balance Overall balance assessment: Needs assistance           Standing balance-Leahy Scale: Poor                   Standardized Balance Assessment Standardized Balance Assessment : Berg Balance Test Berg Balance Test Sit to Stand: Able to stand without using hands and stabilize independently Standing Unsupported: Able to stand safely 2 minutes Sitting with Back Unsupported but Feet Supported on Floor or Stool: Able to sit safely and securely 2 minutes Stand to Sit: Sits safely with minimal use of hands Transfers: Able to transfer safely, minor use of hands Standing Unsupported with Eyes Closed: Able to stand 10  seconds with supervision Standing Ubsupported with Feet Together: Able to place feet together independently but unable to hold for 30 seconds From Standing, Reach Forward with Outstretched Arm: Can reach forward >12 cm safely (5") From Standing Position, Pick up Object from Floor: Able to pick up shoe, needs supervision From Standing Position, Turn to Look Behind Over each Shoulder: Looks behind from both sides and weight shifts well Turn 360 Degrees: Able to turn 360 degrees safely but slowly Standing Unsupported, Alternately Place Feet on  Step/Stool: Able to complete >2 steps/needs minimal assist Standing Unsupported, One Foot in Front: Needs help to step but can hold 15 seconds Standing on One Leg: Tries to lift leg/unable to hold 3 seconds but remains standing independently Total Score: 40/56        Communication    Cognition Arousal: Alert Behavior During Therapy: WFL for tasks assessed/performed   PT - Cognitive impairments: No apparent impairments                       PT - Cognition Comments: AxO x 3 pleasant Retired [employer] who lives home alone. Following commands: Intact      Cueing Cueing Techniques: Verbal cues  Exercises      General Comments        Pertinent Vitals/Pain Pain Assessment Pain Assessment: No/denies pain    Home Living                          Prior Function            PT Goals (current goals can now be found in the care plan section) Progress towards PT goals: Progressing toward goals    Frequency           PT Plan      Co-evaluation              AM-PAC PT "6 Clicks" Mobility   Outcome Measure  Help needed turning from your back to your side while in a flat bed without using bedrails?: None Help needed moving from lying on your back to sitting on the side of a flat bed without using bedrails?: None Help needed moving to and from a bed to a chair (including a wheelchair)?: None Help needed standing up from a chair using your arms (e.g., wheelchair or bedside chair)?: None Help needed to walk in hospital room?: A Little Help needed climbing 3-5 steps with a railing? : A Little 6 Click Score: 22    End of Session Equipment Utilized During Treatment: Gait belt Activity Tolerance: Patient tolerated treatment well Patient left: in bed;with call bell/phone within reach Nurse Communication: Mobility status PT Visit Diagnosis: Difficulty in walking, not elsewhere classified (R26.2)     Time: 1135-1200 PT Time Calculation  (min) (ACUTE ONLY): 25 min  Charges:    $Gait Training: 8-22 mins $Therapeutic Activity: 8-22 mins PT General Charges $$ ACUTE PT VISIT: 1 Visit                     Bess Broody  PTA Acute  Rehabilitation Services Office M-F          [phone]

## 2023-07-21 ENCOUNTER — Encounter: Payer: Self-pay | Admitting: Family Medicine

## 2023-08-12 ENCOUNTER — Ambulatory Visit: Admitting: Physical Therapy

## 2023-08-26 NOTE — Progress Notes (Signed)
 Subjective  Patient ID: [id number] ALCOSER  is a 58 y.o. female here in clinic for:  Chief Complaint  Patient presents with  . Diabetes    Follow up- discuss insulin  Insomnia patient was taking benadryl which is not helping anymore and is waking up every hour does not want Ambien.     The following information was reviewed by members of the visit team:   Past Medical History:  Diagnosis Date  . Anxiety   . Depression   . Diabetes mellitus    (CMD)    type2 insulin   . Hypertension      Past Surgical History:  Procedure Laterality Date  . CATARACT EXTRACTION W/  INTRAOCULAR LENS IMPLANT Right 06/09/2017   Procedure: CATARACT EXTRACTION W/ INTRAOCULAR LENS IMPLANT; SN60WF 12.5  . CATARACT EXTRACTION W/  INTRAOCULAR LENS IMPLANT Left 06/16/2017   Procedure: CATARACT EXTRACTION W/ INTRAOCULAR LENS IMPLANT; SN60WF 11.0  . CESAREAN SECTION, CLASSICAL  1989     Family History  Problem Relation Name Age of Onset  . Diabetes Mother    . Diabetes Sister    . Breast cancer Sister  57  . Hypertension Maternal Aunt    . Hypertension Maternal Uncle       Social History   Socioeconomic History  . Marital status: Married    Spouse name: Not on file  . Number of children: Not on file  . Years of education: Not on file  . Highest education level: Not on file  Occupational History  . Not on file  Tobacco Use  . Smoking status: Former    Current packs/day: 0.01    Average packs/day: (0.5 ttl pk-yrs)    Types: Cigarettes    Start date: 65  . Smokeless tobacco: Never  Substance and Sexual Activity  . Alcohol use: Yes    Alcohol/week: 1.0 standard drink of alcohol    Types: 1 Glasses of wine per week  . Drug use: Never  . Sexual activity: Not Currently  Other Topics Concern  . Not on file  Social History Narrative  . Not on file   Social Drivers of Health   Food Insecurity: Low Risk  (08/26/2023)   Food vital sign   . Within the past 12 months, you worried that your food  would run out before you got money to buy more: Never true   . Within the past 12 months, the food you bought just didn't last and you didn't have money to get more: Never true  Transportation Needs: No Transportation Needs (08/26/2023)   Transportation   . In the past 12 months, has lack of reliable transportation kept you from medical appointments, meetings, work or from getting things needed for daily living? : No  Safety: High Risk (08/26/2023)   Safety   . How often does anyone, including family and friends, physically hurt you?: Never   . How often does anyone, including family and friends, insult or talk down to you?: Rarely   . How often does anyone, including family and friends, threaten you with harm?: Never   . How often does anyone, including family and friends, scream or curse at you?: Rarely  Living Situation: Low Risk  (08/26/2023)   Living Situation   . What is your living situation today?: I have a steady place to live   . Think about the place you live. Do you have problems with any of the following? Choose all that apply:: None/None on this list  Current Outpatient Medications on File Prior to Visit  Medication Sig Dispense Refill  . aspirin  81 mg EC tablet Take 81 mg by mouth.    . B-complex with vitamin C tablet Take by mouth.    . cholecalciferol 25 mcg (1,000 unit) cap Take  by mouth.    . clotrimazole-betamethasone (LOTRISONE) 1-0.05 % cream Apply to affected area bid 45 g 1  . desvenlafaxine  (PRISTIQ ) 100 mg 24 hr tablet Take 1 tablet (100 mg total) by mouth daily. 90 tablet 10  . diphenhydrAMINE (BENADRYL) 25 mg capsule Take 50 mg by mouth nightly as needed for itching.    . fluticasone propionate (FLONASE) 50 mcg/spray nasal spray 2 sprays.    SABRA terconazole (TERAZOL 7) 0.4 % crea vaginal cream 1 applicator of cream in vaginal at bedtime for 7 nights 7 g 1  . [DISCONTINUED] ALPRAZolam  (XANAX ) 0.25 mg tablet Take 1 tablet (0.25 mg total) by mouth nightly as needed  for anxiety. 30 tablet 0  . [DISCONTINUED] Basaglar  KwikPen U-100 Insulin  100 unit/mL (3 mL) pen Inject 30 Units under the skin in the morning. 3 mL 5  . [DISCONTINUED] busPIRone  (BUSPAR ) 30 mg tablet Take 1 tablet (30 mg total) by mouth 2 (two) times a day. 180 tablet 1  . prazosin  (MINIPRESS ) 2 mg capsule Take 1 capsule (2 mg total) by mouth at bedtime. 90 capsule 1  . ramipriL  (ALTACE ) 5 mg capsule TK 1 C PO QD (Patient not taking: Reported on 08/26/2023)  1   No current facility-administered medications on file prior to visit.     Insulin  lispro, Atorvastatin, Hydrocodone-acetaminophen , and Tramadol   Most recent PHQ-2 results: Patient Health Questionnaire-2 Score: 3 (08/26/2023 10:45 AM)  Most recent PHQ-9 result: Patient Health Questionnaire-9 Score: 12 (08/26/2023 10:45 AM)  PHQ-9 Question # 9 Thoughts that you would be better off dead or hurting yourself in some way: Not at all (08/26/2023 10:45 AM)  Interpretation:   PHQ-9 Interpretation: Positive (Moderate Depression Severity) (08/26/2023 10:45 AM)  Depression Plan: Prescribed antidepressant medication(s) and Situational stressors reviewed, patient to return to office if symptoms persist or worsen   Recent Results (from the past 12 weeks)  Tissue Exam   Collection Time: 06/24/23  2:53 PM  Result Value Ref Range   Case Report      Surgical Pathology Report                         Case: [id number]                               Authorizing Provider:  Ronal Karna Ferraris, MD    Collected:           06/24/2023 1453             Ordering Location:     [facility]  Received:            06/25/2023 1007                                    [facility] - OUTPATIENT  ENDOSCOPY [facility]                                                            Pathologist:           Jenkins Erika Glasser, MD                                                     Specimens:    A) - Colon Polyp, Left/Descending, polyp - r/o adenoma                                             B) - Colon Polyp, Rectum, polyp - r/o adenoma                                                      C) - Colon Polyp, Rectosigmoid, polyp - r/o adenoma                                      Final Diagnosis      A.  TISSUE LABELED LEFT/DESCENDING COLON POLYP, BIOPSIES:  TUBULAR ADENOMA.  B.  TISSUE LABELED RECTUM POLYP, BIOPSY:  HYPERPLASTIC POLYP.  C.  TISSUE LABELED RECTOSIGMOID POLYP, BIOPSY:  TUBULAR ADENOMA.     Gross Description      A. Specimen received in 10% neutral buffered formalin labeled with the patient's name, DOB, and left/descending colon polyp consists of 2 irregularly shaped piece(s) of tan soft tissue fragments, measuring up to 0.6 x 0.3 x 0.2 cm.  The specimen is totally submitted in one cassette(s), labeled A1. B. Specimen received in 10% neutral buffered formalin labeled with the patient's name, DOB, and rectum polyp consists of 1 irregularly shaped piece(s) of tan soft tissue fragment, measuring up to 0.3 x 0.2 x 0.1 cm.  The specimen is totally submitted in one cassette(s), labeled B1. C. Specimen received in 10% neutral buffered formalin labeled with the patient's name, DOB, and rectosigmoid polyp consists consists of 1 irregularly shaped piece(s) of tan soft tissue fragment, measuring up to 0.3 x 0.2 x 0.2 cm.  The specimen is totally submitted in one cassette(s), labeled C1.  All specimens are formalin-processed and paraffin-embedded unless otherwise noted. Gross performed at: [facility] [address] [place], [place]  [address] CLIA #: [id number]   Gross completed by Benjiman Kenner, P.A., on June 25, 2023    Clinical Information      Diagnosis: Z12.11 - Colon cancer screening [ICD-10-CM]         Point of Service      [facility], CLIA# [id number], [address], [place] [address]    Comprehensive Metabolic Panel   Collection Time: 07/27/23 11:09 AM  Result Value Ref Range  Sodium 136 136 - 145 mmol/L   Potassium 4.4 3.5 - 5.1 mmol/L   Chloride 101 98 - 107 mmol/L   CO2 26 21 - 31 mmol/L   Anion Gap 9 6 - 14 mmol/L   Glucose, Random 231 (H) 70 - 99 mg/dL   Blood Urea Nitrogen (BUN) 9 7 - 25 mg/dL   Creatinine 9.06 9.39 - 1.20 mg/dL   eGFR 71 >40 fO/fpw/8.26f7   Albumin 3.8 3.5 - 5.7 g/dL   Total Protein 6.6 6.4 - 8.9 g/dL   Bilirubin, Total 0.6 0.3 - 1.0 mg/dL   Alkaline Phosphatase (ALP) 55 34 - 104 U/L   Aspartate Aminotransferase (AST) 11 (L) 13 - 39 U/L   Alanine Aminotransferase (ALT) 15 7 - 52 U/L   Calcium  9.6 8.6 - 10.3 mg/dL   BUN/Creatinine Ratio    TSH With Reflex To Free T4   Collection Time: 07/27/23 11:09 AM  Result Value Ref Range   TSH 2.034 0.450 - 5.330 uIU/mL  CBC with Differential   Collection Time: 07/27/23 11:09 AM  Result Value Ref Range   WBC 6.20 4.40 - 11.00 10*3/uL   RBC 4.50 4.10 - 5.10 10*6/uL   Hemoglobin 13.7 12.3 - 15.3 g/dL   Hematocrit 59.9 64.0 - 44.6 %   Mean Corpuscular Volume (MCV) 88.9 80.0 - 96.0 fL   Mean Corpuscular Hemoglobin (MCH) 30.4 27.5 - 33.2 pg   Mean Corpuscular Hemoglobin Conc (MCHC) 34.2 33.0 - 37.0 g/dL   Red Cell Distribution Width (RDW) 13.2 12.3 - 17.0 %   Platelet Count (PLT) 487 (H) 150 - 450 10*3/uL   Mean Platelet Volume (MPV) 6.6 (L) 6.8 - 10.2 fL   Neutrophils % 46 %   Lymphocytes % 46 %   Monocytes % 7 %   Eosinophils % 0 %   Basophils % 0 %   nRBC % 0 %   Neutrophils Absolute 2.90 1.80 - 7.80 10*3/uL   Lymphocytes # 2.80 1.00 - 4.80 10*3/uL   Monocytes # 0.40 0.00 - 0.80 10*3/uL   Eosinophils # 0.00 0.00 - 0.50 10*3/uL   Basophils # 0.00 0.00 - 0.20 10*3/uL   nRBC Absolute 0.00 <=0.00 10*3/uL  POC Glucose (Hemocue/NOVA)   Collection Time: 07/27/23 11:32 AM  Result Value Ref Range   Glucose, POC 222 (A) 70 - 125 mg/dL   Kit/Device Lot # [id number]    Kit/Device Expiration Date  08/05/2023   POC Glucose (Hemocue/NOVA)   Collection Time: 08/26/23 10:58 AM  Result Value Ref Range   Glucose, POC 171 (A) 70 - 125 mg/dL   Kit/Device Lot # [id number]    Kit/Device Expiration Date 10/23/2023   POC Hemoglobin A1c   Collection Time: 08/26/23 10:59 AM  Result Value Ref Range   Hemoglobin A1c (POC) 9.1 (A) 4.2 - 5.6 %   A1C Information      A1C Diagnostic Criteria: Prediabetes:5.7% - 6.4% Diabetes:>=6.5% A1C Glycemic Goals: Older Adults: <7.0% Children and Adolescents: <7.0% Pregnant Women: <6.0%   Kit/Device Lot # [id number]    Kit/Device Expiration Date 05/09/2025      HPI Patient is a 59 year old female with a history of diabetes who is here today for follow-up of diabetes. Also has a new complaint of insomnia and chronic lower back back.  Patient reports that she has been having worsening insomnia since being in the hospital earlier this year.  She is currently on alprazolam  0.25 mg at night in addition to using Benadryl but she states that  this does not help her sleep throughout the night.  She believes it may be due to her depression.  There is no associated issues with snoring, nightmares.  She would like a alternative to the Benadryl as it seems not to be working as well.  She also reports that ever since her hospital visit, she has been having bilateral lower back pain and that her right side is chaotic that is often relapsed.  She states that it hurts whenever she touches both sides.  Denies any urinary symptoms or fevers or chills.  There is no numbness or weakness of her lower legs.  She does state that she had a slipped disk in her lower back, many years ago but it has not been imaged recently.    Diabetes She presents for her follow-up diabetic visit. She has type 2 diabetes mellitus. No MedicAlert identification noted. There are no hypoglycemic associated symptoms. Associated symptoms include fatigue. Pertinent negatives for diabetes include no blurred vision,  no chest pain, no foot paresthesias, no foot ulcerations, no polydipsia, no polyphagia, no polyuria, no visual change, no weakness and no weight loss. Symptoms are stable. There are no diabetic complications. Current diabetic treatments: Lantus  10 units and Novolog  8 units TID.  However, her insurance company does not cover Lantus  and she needs to get Basaglar . She is compliant with treatment all of the time. (Mid 200s at home)     Review of Systems  Constitutional:  Positive for fatigue. Negative for weight loss.  Eyes:  Negative for blurred vision.  Cardiovascular:  Negative for chest pain.  Endocrine: Negative for polydipsia, polyphagia and polyuria.  Neurological:  Negative for weakness.  All other systems reviewed and are negative.     Objective  BP 125/73 (BP Location: Left arm, Patient Position: Sitting)   Pulse 68   Ht 1.676 m (5' 6)   Wt 81.1 kg (178 lb 14.4 oz)   BMI 28.88 kg/m    Physical Exam Vitals reviewed.  Constitutional:      Appearance: Normal appearance.  HENT:     Head: Normocephalic and atraumatic.     Mouth/Throat:     Mouth: Mucous membranes are moist.  Eyes:     Pupils: Pupils are equal, round, and reactive to light.  Cardiovascular:     Rate and Rhythm: Normal rate and regular rhythm.     Pulses: Normal pulses.     Heart sounds: Normal heart sounds.  Abdominal:     General: Abdomen is flat.     Tenderness: There is no abdominal tenderness.  Musculoskeletal:        General: Normal range of motion.     Cervical back: Neck supple.  Skin:    General: Skin is warm.     Capillary Refill: Capillary refill takes less than 2 seconds.  Neurological:     General: No focal deficit present.     Mental Status: She is alert and oriented to person, place, and time.  Psychiatric:        Mood and Affect: Mood normal.        Behavior: Behavior normal.     Assessment/Plan  Diagnoses and all orders for this visit:  Type 2 diabetes mellitus with  hyperosmolarity without coma, with long-term current use of insulin     (CMD) -DM is uncontrolled.  Will switch over to Basaglar  as this is covered by her insurance.  Increased dose to 15 units daily from today.  Instructed pt to increase this by  5 units at night if her blood sugars remain greater than 200  despite her current regimen;  she is to also continue NovoLog  at 8 units 3 times daily.  Strict diabetic diet encouraged and education given.  RTC in 3 months for follow-up of diabetes -     POC Glucose (Hemocue/NOVA): 171 ( uncontrolled) -     POC Hemoglobin A1c: 9.1% -     Basaglar  KwikPen U-100 Insulin  100 unit/mL (3 mL) pen; Inject 15 Units under the skin in the morning. -     insulin  aspart U-100 (NovoLOG  Flexpen U-100 Insulin ) 100 unit/mL (3 mL) pen; Inject 8 Units under the skin 3 (three) times a day before meals.  Insomnia, unspecified type -Uncontrolled.  She is to discontinue Benadryl and try low-dose doxepin instead and see if this helps.  Education given.  RTC 3 months -     doxepin (SILENOR) 3 mg tab tablet; Take 1 tablet (3 mg total) by mouth at bedtime.   Anxiety and depression -     busPIRone  (BUSPAR ) 30 mg tablet; Take 1 tablet (30 mg total) by mouth 2 (two) times a day.  Chronic bilateral low back pain with right-sided sciatica -     XR Spine Lumbar Complete 4+ Views; Future -     Patient declined physical therapy referral today and stated that she had enough pills to deal with at this time.  Education given.   Electronically signed by: Elveria Tedi Kaiser, MD 08/26/2023 11:50 AM

## 2023-11-05 NOTE — Telephone Encounter (Signed)
 This CMA called the patient to reschedule the appointment due to the provider being out of clinic. Patient voiced, The appointment can be cancelled and not reschedule at this time. This CMA cancelled the appointment.

## 2024-02-07 ENCOUNTER — Encounter (HOSPITAL_COMMUNITY): Payer: Self-pay

## 2024-02-07 ENCOUNTER — Emergency Department (HOSPITAL_COMMUNITY)
Admission: EM | Admit: 2024-02-07 | Discharge: 2024-02-07 | Disposition: A | Discharge status: Home / Self Care | Attending: Emergency Medicine | Admitting: Emergency Medicine

## 2024-02-07 DIAGNOSIS — Z7982 Long term (current) use of aspirin: Secondary | ICD-10-CM | POA: Insufficient documentation

## 2024-02-07 DIAGNOSIS — E860 Dehydration: Secondary | ICD-10-CM | POA: Diagnosis not present

## 2024-02-07 DIAGNOSIS — R112 Nausea with vomiting, unspecified: Secondary | ICD-10-CM | POA: Diagnosis present

## 2024-02-07 LAB — CBC WITH DIFFERENTIAL/PLATELET
Abs Immature Granulocytes: 0.02 K/uL (ref 0.00–0.07)
Basophils Absolute: 0 K/uL (ref 0.0–0.1)
Basophils Relative: 0 %
Eosinophils Absolute: 0 K/uL (ref 0.0–0.5)
Eosinophils Relative: 0 %
HCT: 39.5 % (ref 36.0–46.0)
Hemoglobin: 13.9 g/dL (ref 12.0–15.0)
Immature Granulocytes: 0 %
Lymphocytes Relative: 48 %
Lymphs Abs: 3.8 K/uL (ref 0.7–4.0)
MCH: 29.7 pg (ref 26.0–34.0)
MCHC: 35.2 g/dL (ref 30.0–36.0)
MCV: 84.4 fL (ref 80.0–100.0)
Monocytes Absolute: 0.6 K/uL (ref 0.1–1.0)
Monocytes Relative: 8 %
Neutro Abs: 3.4 K/uL (ref 1.7–7.7)
Neutrophils Relative %: 44 %
Platelets: 302 K/uL (ref 150–400)
RBC: 4.68 MIL/uL (ref 3.87–5.11)
RDW: 11.8 % (ref 11.5–15.5)
WBC: 7.8 K/uL (ref 4.0–10.5)
nRBC: 0 % (ref 0.0–0.2)

## 2024-02-07 LAB — COMPREHENSIVE METABOLIC PANEL WITH GFR
ALT: 16 U/L (ref 0–44)
AST: 14 U/L — ABNORMAL LOW (ref 15–41)
Albumin: 3.5 g/dL (ref 3.5–5.0)
Alkaline Phosphatase: 50 U/L (ref 38–126)
Anion gap: 11 (ref 5–15)
BUN: 19 mg/dL (ref 6–20)
CO2: 25 mmol/L (ref 22–32)
Calcium: 8.5 mg/dL — ABNORMAL LOW (ref 8.9–10.3)
Chloride: 100 mmol/L (ref 98–111)
Creatinine, Ser: 0.95 mg/dL (ref 0.44–1.00)
GFR, Estimated: >60 mL/min (ref >60)
Glucose, Bld: 154 mg/dL — ABNORMAL HIGH (ref 70–99)
Potassium: 4.3 mmol/L (ref 3.5–5.1)
Sodium: 136 mmol/L (ref 135–145)
Total Bilirubin: 1 mg/dL (ref 0.0–1.2)
Total Protein: 6.4 g/dL — ABNORMAL LOW (ref 6.5–8.1)

## 2024-02-07 LAB — I-STAT CHEM 8, ED
BUN: 22 mg/dL — ABNORMAL HIGH (ref 6–20)
Calcium, Ion: 1.04 mmol/L — ABNORMAL LOW (ref 1.15–1.40)
Chloride: 101 mmol/L (ref 98–111)
Creatinine, Ser: 1.1 mg/dL — ABNORMAL HIGH (ref 0.44–1.00)
Glucose, Bld: 155 mg/dL — ABNORMAL HIGH (ref 70–99)
HCT: 39 % (ref 36.0–46.0)
Hemoglobin: 13.3 g/dL (ref 12.0–15.0)
Potassium: 4.2 mmol/L (ref 3.5–5.1)
Sodium: 136 mmol/L (ref 135–145)
TCO2: 24 mmol/L (ref 22–32)

## 2024-02-07 LAB — URINALYSIS, W/ REFLEX TO CULTURE (INFECTION SUSPECTED)
Bilirubin Urine: NEGATIVE
Glucose, UA: NEGATIVE mg/dL
Ketones, ur: 5 mg/dL — AB
Leukocytes,Ua: NEGATIVE
Nitrite: NEGATIVE
Protein, ur: NEGATIVE mg/dL
Specific Gravity, Urine: 1.011 (ref 1.005–1.030)
pH: 6 (ref 5.0–8.0)

## 2024-02-07 LAB — CBG MONITORING, ED: Glucose-Capillary: 198 mg/dL — ABNORMAL HIGH (ref 70–99)

## 2024-02-07 LAB — I-STAT CG4 LACTIC ACID, ED: Lactic Acid, Venous: 1.4 mmol/L (ref 0.5–1.9)

## 2024-02-07 MED ORDER — SODIUM CHLORIDE 0.9 % IV BOLUS
1000.0000 mL | Freq: Once | INTRAVENOUS | Status: AC
  Administered 2024-02-07: 1000 mL via INTRAVENOUS

## 2024-02-07 MED ORDER — SODIUM CHLORIDE 0.9 % IV SOLN: Freq: Once | INTRAVENOUS | Status: AC

## 2024-02-07 NOTE — ED Triage Notes (Signed)
 Patient presents to the ER via EMS from her pcp with c/o dizziness, n/v, weakness. Patient reports that she has been sick for a few days and was only able to get in to see her PCP today. PCP felt she need further evaluation.

## 2024-02-07 NOTE — ED Provider Notes (Signed)
 [place] EMERGENCY DEPARTMENT AT [facility] Provider Note   CSN: [id number] Arrival date & time: 02/07/24  1219     Patient presents with: Dizziness   Mary Barrera is a 58 y.o. female.  {Add pertinent medical, surgical, social history, OB history to HPI:5495} 58 year old female with prior medical history as detailed below presents for evaluation.  Patient complains of 4 to 5 days of nausea, vomiting, weakness.  Her last emesis was on Saturday.  Today is Monday.  She was sent to the ED by her PCP for further evaluation.  She denies current nausea.  She denies pain.  She denies fever.  She is concerned about possible recurrent DKA.  She reports compliance with previously prescribed medications.  The history is provided by the patient and medical records.       Prior to Admission medications   Medication Sig Start Date End Date Taking? Authorizing Provider  ALPRAZolam  (XANAX ) 0.5 MG tablet Take 1 tablet (0.5 mg total) by mouth 3 (three) times daily as needed for anxiety. 04/22/22   Rhys Verneita DASEN, PA-C  aspirin  EC 81 MG tablet Take 81 mg by mouth daily.    [provider]  Blood Glucose Monitoring Suppl (BLOOD GLUCOSE MONITOR SYSTEM) w/Device KIT Use as directed to test blood sugar 3 times daily. 07/19/23   Christobal Guadalajara, MD  celecoxib (CELEBREX) 200 MG capsule Take 200 mg by mouth daily as needed for mild pain (pain score 1-3). 07/17/16   [provider]  desvenlafaxine  (PRISTIQ ) 100 MG 24 hr tablet TAKE 2 TABLETS BY MOUTH EVERY DAY Patient taking differently: Take 100 mg by mouth daily. 08/14/22   Rhys Verneita DASEN, PA-C  diphenhydrAMINE (BENADRYL) 25 mg capsule Take 25 mg by mouth every 8 (eight) hours as needed for sleep.    [provider]  fluticasone (FLONASE) 50 MCG/ACT nasal spray Place 2 sprays into both nostrils 2 (two) times daily as needed for allergies. 05/22/16   [provider]  Glucose Blood (BLOOD GLUCOSE TEST STRIPS) STRP Use  as directed to check blood sugar 3 times daily. 07/19/23   Christobal Guadalajara, MD  insulin  aspart (NOVOLOG ) 100 UNIT/ML FlexPen Inject 8 Units into the skin 3 (three) times daily with meals. If eating and Blood Glucose (BG) 80 or higher inject 8 units for meal coverage and add correction dose per scale. If not eating, correction dose only. BG <150= 0 unit; BG 150-200= 1 unit; BG 201-250= 2 unit; BG 251-300= 3 unit; BG 301-350= 4 unit; BG 351-400= 5 unit; BG >400= 6 unit and Call Primary Care 07/19/23   Christobal Guadalajara, MD  insulin  glargine (LANTUS  SOLOSTAR) 100 UNIT/ML Solostar Pen Inject 30 Units into the skin daily. 07/19/23 08/19/23  Christobal Guadalajara, MD  Insulin  Pen Needle (PEN NEEDLES) 31G X 5 MM MISC Use 3 times daily with insulin  07/19/23   Christobal Guadalajara, MD  Lancet Device MISC 1 each by Does not apply route 3 (three) times daily. May dispense any manufacturer covered by patient's insurance. 07/19/23   Christobal Guadalajara, MD  Lancets MISC Use as directed to test blood sugar 3 times daily. 07/19/23   Christobal Guadalajara, MD  potassium chloride  (KLOR-CON ) 10 MEQ tablet Take 1 tablet (10 mEq total) by mouth daily for 7 days. 07/19/23 07/27/23  Christobal Guadalajara, MD  ramipril  (ALTACE ) 5 MG capsule Take 5 mg by mouth at bedtime. 07/09/16   [provider]    Allergies: Desvenlafaxine , Humalog [insulin  lispro], Amoxicillin, Atorvastatin, Hydrocodone-acetaminophen , Oxycodone,  Simvastatin, Tramadol, and Tramadol hcl    Review of Systems  All other systems reviewed and are negative.   Updated Vital Signs There were no vitals taken for this visit.  Physical Exam Vitals and nursing note reviewed.  Constitutional:      General: She is not in acute distress.    Appearance: She is well-developed.  HENT:     Head: Normocephalic and atraumatic.  Eyes:     Conjunctiva/sclera: Conjunctivae normal.  Cardiovascular:     Rate and Rhythm: Normal rate and regular rhythm.     Heart sounds: No murmur heard. Pulmonary:     Effort: Pulmonary effort  is normal. No respiratory distress.     Breath sounds: Normal breath sounds.  Abdominal:     Palpations: Abdomen is soft.     Tenderness: There is no abdominal tenderness.  Musculoskeletal:        General: No swelling.     Cervical back: Neck supple.  Skin:    General: Skin is warm and dry.     Capillary Refill: Capillary refill takes less than 2 seconds.  Neurological:     Mental Status: She is alert.  Psychiatric:        Mood and Affect: Mood normal.     (all labs ordered are listed, but only abnormal results are displayed) Labs Reviewed  CBC WITH DIFFERENTIAL/PLATELET  COMPREHENSIVE METABOLIC PANEL WITH GFR  URINALYSIS, W/ REFLEX TO CULTURE (INFECTION SUSPECTED)  CBG MONITORING, ED  I-STAT CHEM 8, ED  I-STAT CG4 LACTIC ACID, ED    EKG: None  Radiology: No results found.  {Document cardiac monitor, telemetry assessment procedure when appropriate:32947} Procedures   Medications Ordered in the ED  sodium chloride  0.9 % bolus 1,000 mL (has no administration in time range)      {Click here for ABCD2, HEART and other calculators REFRESH Note before signing:1}                              Medical Decision Making Amount and/or Complexity of Data Reviewed Labs: ordered.  Risk Prescription drug management.   ***  {Document critical care time when appropriate  Document review of labs and clinical decision tools ie CHADS2VASC2, etc  Document your independent review of radiology images and any outside records  Document your discussion with family members, caretakers and with consultants  Document social determinants of health affecting pt's care  Document your decision making why or why not admission, treatments were needed:32947:::1}   Final diagnoses:  None    ED Discharge Orders     None

## 2024-02-07 NOTE — Discharge Instructions (Signed)
 Laboratory studies today demonstrated dehydration.  Likely from your episode of nausea and vomiting from Wednesday to Saturday.  This is also likely explaining your decreased urine output.  Please continue to stay hydrated by drinking 5-6 water bottles a day or more.  You should start to have normal urine production and clear urine in the next 24 hours if you are hydrating properly.

## 2024-02-07 NOTE — ED Provider Notes (Signed)
 3:59 PM Patient signed out to me by previous ED physician. Pt is a 58 yo female with pmh of DM on insulin  presenting for n/a. Concerns for DKA.  IVF given.   Pending: Labs  Plan: likely DC, r/o DKA     Physical Exam  BP (!) 144/92   Pulse 80   Temp (!) 96 F (35.6 C)   Ht 5' 6 (1.676 m)   Wt 85.3 kg   BMI 30.34 kg/m   Physical Exam  Procedures  Procedures  ED Course / MDM    Medical Decision Making Amount and/or Complexity of Data Reviewed Labs: ordered.  Risk Prescription drug management.   No DKA.  Patient states she had episodes of nausea and vomiting from Wednesday to Saturday last week.  She states she then had decreased urine output which is why she saw her PCP today.  Her nausea and vomiting has resolved.  Her decreased output is probably secondary to dehydration.  Laboratory studies are showing a minimal AKI likely secondary to dehydration.  Recommending to increase hydration at home.  Patient also received IV fluids while here in the ED.  UA demonstrates no UTI.  Stable for discharge home at this time with close follow-up with PCP.  Patient in no distress and overall condition improved here in the ED. Detailed discussions were had with the patient regarding current findings, and need for close f/u with PCP or on call doctor. The patient has been instructed to return immediately if the symptoms worsen in any way for re-evaluation. Patient verbalized understanding and is in agreement with current care plan. All questions answered prior to discharge.       Elnor Bernarda SQUIBB, DO 02/07/24 1926

## 2024-03-27 ENCOUNTER — Emergency Department (HOSPITAL_COMMUNITY)

## 2024-03-27 ENCOUNTER — Emergency Department (HOSPITAL_COMMUNITY)
Admission: EM | Admit: 2024-03-27 | Discharge: 2024-03-28 | Attending: Emergency Medicine | Admitting: Emergency Medicine

## 2024-03-27 DIAGNOSIS — R112 Nausea with vomiting, unspecified: Secondary | ICD-10-CM | POA: Insufficient documentation

## 2024-03-27 DIAGNOSIS — W19XXXA Unspecified fall, initial encounter: Secondary | ICD-10-CM | POA: Diagnosis not present

## 2024-03-27 DIAGNOSIS — Z5321 Procedure and treatment not carried out due to patient leaving prior to being seen by health care provider: Secondary | ICD-10-CM | POA: Insufficient documentation

## 2024-03-27 DIAGNOSIS — R531 Weakness: Secondary | ICD-10-CM | POA: Insufficient documentation

## 2024-03-27 DIAGNOSIS — R42 Dizziness and giddiness: Secondary | ICD-10-CM | POA: Insufficient documentation

## 2024-03-27 NOTE — ED Triage Notes (Signed)
 Patient BIB EMS from home for dizziness since Tuesday. Generalized weakness, N/V and fell yesterday and states she hit her head. Denied LOC, no blood thinners.        146/62 86 100% RA CBG 148

## 2024-03-28 NOTE — ED Notes (Signed)
Patient stated she is leaving 

## 2024-04-26 ENCOUNTER — Encounter: Payer: Self-pay | Admitting: Neurology

## 2024-07-28 ENCOUNTER — Ambulatory Visit: Payer: Self-pay | Admitting: Neurology
